# Patient Record
Sex: Female | Born: 1960
Health system: Southern US, Community
[De-identification: ages and names within clinical notes are randomized; demographics above are authoritative.]

## PROBLEM LIST (undated history)

## (undated) DIAGNOSIS — B019 Varicella without complication: Secondary | ICD-10-CM

## (undated) HISTORY — DX: Varicella without complication: B01.9

---

## 1991-05-09 HISTORY — PX: OVARIAN CYST SURGERY: SHX726

## 2014-08-24 ENCOUNTER — Other Ambulatory Visit: Payer: Self-pay

## 2014-08-24 DIAGNOSIS — Z1231 Encounter for screening mammogram for malignant neoplasm of breast: Secondary | ICD-10-CM

## 2014-09-02 ENCOUNTER — Encounter (INDEPENDENT_AMBULATORY_CARE_PROVIDER_SITE_OTHER): Payer: Self-pay

## 2014-09-02 ENCOUNTER — Ambulatory Visit
Admission: RE | Admit: 2014-09-02 | Discharge: 2014-09-02 | Disposition: A | Discharge status: Home / Self Care | Payer: 59 | Source: Ambulatory Visit

## 2014-09-02 DIAGNOSIS — Z1231 Encounter for screening mammogram for malignant neoplasm of breast: Secondary | ICD-10-CM

## 2014-09-04 ENCOUNTER — Other Ambulatory Visit: Payer: Self-pay | Admitting: Internal Medicine

## 2014-09-04 DIAGNOSIS — R928 Other abnormal and inconclusive findings on diagnostic imaging of breast: Secondary | ICD-10-CM

## 2014-09-10 ENCOUNTER — Ambulatory Visit
Admission: RE | Admit: 2014-09-10 | Discharge: 2014-09-10 | Disposition: A | Discharge status: Home / Self Care | Payer: 59 | Source: Ambulatory Visit | Attending: Internal Medicine | Admitting: Internal Medicine

## 2014-09-10 DIAGNOSIS — R928 Other abnormal and inconclusive findings on diagnostic imaging of breast: Secondary | ICD-10-CM

## 2014-10-23 LAB — HM COLONOSCOPY

## 2015-02-09 ENCOUNTER — Other Ambulatory Visit: Payer: Self-pay | Admitting: Internal Medicine

## 2015-02-09 DIAGNOSIS — N6489 Other specified disorders of breast: Secondary | ICD-10-CM

## 2015-02-09 DIAGNOSIS — R921 Mammographic calcification found on diagnostic imaging of breast: Secondary | ICD-10-CM

## 2015-03-16 ENCOUNTER — Ambulatory Visit
Admission: RE | Admit: 2015-03-16 | Discharge: 2015-03-16 | Disposition: A | Discharge status: Home / Self Care | Payer: 59 | Source: Ambulatory Visit | Attending: Internal Medicine | Admitting: Internal Medicine

## 2015-03-16 DIAGNOSIS — R921 Mammographic calcification found on diagnostic imaging of breast: Secondary | ICD-10-CM

## 2015-03-16 DIAGNOSIS — N6489 Other specified disorders of breast: Secondary | ICD-10-CM

## 2015-07-31 DIAGNOSIS — H524 Presbyopia: Secondary | ICD-10-CM | POA: Diagnosis not present

## 2015-07-31 DIAGNOSIS — H5203 Hypermetropia, bilateral: Secondary | ICD-10-CM | POA: Diagnosis not present

## 2015-08-16 ENCOUNTER — Other Ambulatory Visit: Payer: Self-pay

## 2015-08-16 DIAGNOSIS — Z1231 Encounter for screening mammogram for malignant neoplasm of breast: Secondary | ICD-10-CM

## 2015-09-08 ENCOUNTER — Ambulatory Visit
Admission: RE | Admit: 2015-09-08 | Discharge: 2015-09-08 | Disposition: A | Discharge status: Home / Self Care | Payer: 59 | Source: Ambulatory Visit

## 2015-09-08 DIAGNOSIS — Z1231 Encounter for screening mammogram for malignant neoplasm of breast: Secondary | ICD-10-CM

## 2015-09-20 DIAGNOSIS — Z01419 Encounter for gynecological examination (general) (routine) without abnormal findings: Secondary | ICD-10-CM | POA: Diagnosis not present

## 2015-09-20 DIAGNOSIS — Z1212 Encounter for screening for malignant neoplasm of rectum: Secondary | ICD-10-CM | POA: Diagnosis not present

## 2015-09-20 DIAGNOSIS — Z0000 Encounter for general adult medical examination without abnormal findings: Secondary | ICD-10-CM | POA: Diagnosis not present

## 2015-11-18 ENCOUNTER — Other Ambulatory Visit: Payer: Self-pay | Admitting: Obstetrics & Gynecology

## 2015-11-18 DIAGNOSIS — L237 Allergic contact dermatitis due to plants, except food: Secondary | ICD-10-CM | POA: Insufficient documentation

## 2015-11-18 MED ORDER — PREDNISONE 20 MG PO TABS: ORAL_TABLET | ORAL | Status: DC

## 2015-11-18 MED FILL — predniSONE 20 MG TABS: 20 | 21 days supply | Qty: 42 | Fill #0

## 2015-11-18 NOTE — Progress Notes (Signed)
Pt was exposed to poison ivy this weekend.  Lesion on her left leg, left arm, and trunk are progressively getting worse.  Topical steroids and baths are not helping.  Prednisone taper ordered.  Pt advised to complete 21 day course to decrease the chance of rebound.  If worsens, pt to go to PCP.    Pt denies medical problems or past medical history Medications:  OTC vitamins Alergies:  Denies  Guss Bunde., MD

## 2016-05-24 ENCOUNTER — Ambulatory Visit: Payer: 59 | Admitting: Family

## 2016-06-13 ENCOUNTER — Encounter: Payer: Self-pay | Admitting: Nurse Practitioner

## 2016-06-13 ENCOUNTER — Ambulatory Visit (INDEPENDENT_AMBULATORY_CARE_PROVIDER_SITE_OTHER): Payer: 59 | Admitting: Nurse Practitioner

## 2016-06-13 VITALS — BP 120/78 | HR 78 | Temp 98.9°F | Ht 64.0 in | Wt 127.0 lb

## 2016-06-13 DIAGNOSIS — R202 Paresthesia of skin: Secondary | ICD-10-CM | POA: Diagnosis not present

## 2016-06-13 DIAGNOSIS — R0789 Other chest pain: Secondary | ICD-10-CM

## 2016-06-13 DIAGNOSIS — M25551 Pain in right hip: Secondary | ICD-10-CM | POA: Diagnosis not present

## 2016-06-13 DIAGNOSIS — M25552 Pain in left hip: Secondary | ICD-10-CM | POA: Diagnosis not present

## 2016-06-13 NOTE — Progress Notes (Signed)
Pre visit review using our clinic review tool, if applicable. No additional management support is needed unless otherwise documented below in the visit note. 

## 2016-06-13 NOTE — Progress Notes (Signed)
Subjective:    Patient ID: Julia Weiss, female    DOB: March 22, 1961, 56 y.o.   MRN: [id number]  Patient presents today for establish care (new patient)  Hip Pain   Incident onset: several months. There was no injury mechanism. The pain is present in the left hip and right hip. The quality of the pain is described as aching (stiffness). The pain has been fluctuating since onset. Pertinent negatives include no inability to bear weight, loss of motion, loss of sensation, muscle weakness, numbness or tingling. She reports no foreign bodies present. Exacerbated by: prolonged sitting. Treatments tried: movement.  Chest Pain   This is a chronic problem. The current episode started more than 1 year ago. The onset quality is gradual. The problem occurs intermittently. The problem has been waxing and waning. The pain is present in the lateral region. The pain is mild. The quality of the pain is described as sharp. The pain does not radiate. Pertinent negatives include no abdominal pain, back pain, claudication, cough, dizziness, exertional chest pressure, fever, headaches, irregular heartbeat, lower extremity edema, malaise/fatigue, nausea, near-syncope, numbness, orthopnea, palpitations, PND, shortness of breath, sputum production or syncope. Risk factors include post-menopausal.  Her family medical history is significant for heart disease and hypertension.  Pertinent negatives for family medical history include: no stroke and no sudden death. Prior workup: ECG done Aug 27, 2014 (normal per patient)     Immunizations: (TDAP, Hep C screen, Pneumovax, Influenza, zoster)  Health Maintenance  Topic Date Due  .  Hepatitis C: One time screening is recommended by Center for Disease Control  (CDC) for  adults born from [birth year] through 27-Aug-1962.   10-25-1960  . Pap Smear  07/27/1982  . Colon Cancer Screening  07/27/2011  . Mammogram  09/07/2017  . Tetanus Vaccine  03/08/2024  . Flu Shot  Addressed  . HIV Screening   Addressed   Diet: healthy Weight:  Wt Readings from Last 3 Encounters:  06/13/16 127 lb (57.6 kg)   Exercise:walking  Fall Risk: Fall Risk  06/13/2016  Falls in the past year? No   Home Safety:home alone Depression/Suicide: Depression screen [facility] 2/9 06/13/2016  Decreased Interest 0  Down, Depressed, Hopeless 0  PHQ - 2 Score 0   No flowsheet data found. Colonoscopy (every 5-69yrs, >50-79yrs):2016 (normal per patient) Pap Smear (every 68yrs for >21-29 without HPV, every 31yrs for >30-22yrs with HPV): [facility] last done 09/2015(normal per patient) Mammogram (yearly, >80yrs):last done 09/2015 (normal per patient)- 3D mammogram needed due to fibrous breast tissue) Vision:up to date  Dental:up to date Sexual History (birth control, marital status, STD):divorced, 1 adult child  Medications and allergies reviewed with patient and updated if appropriate.  Patient Active Problem List   Diagnosis Date Noted  . Atypical chest pain 06/13/2016  . Pain of both hip joints 06/13/2016  . Paresthesia of both hands 06/13/2016  . Poison ivy dermatitis 11/18/2015    Current Outpatient Prescriptions on File Prior to Visit  Medication Sig Dispense Refill  . predniSONE (DELTASONE) 20 MG tablet Take 3 tablets daily by mouth for 7 days, then 2 tablets daily by mouth for 7 days, then take 1 tablet daily by mouth for 7 days (Patient not taking: Reported on 06/13/2016) 42 tablet 0   No current facility-administered medications on file prior to visit.     Past Medical History:  Diagnosis Date  . Chicken pox     Past Surgical History:  Procedure Laterality Date  .  OVARIAN CYST SURGERY  1993   removal of cyst during pregnancy/came back normal    Social History   Social History  . Marital status: Divorced    Spouse name: N/A  . Number of children: N/A  . Years of education: N/A   Social History Main Topics  . Smoking status: Never Smoker  . Smokeless tobacco:  Never Used  . Alcohol use Yes     Comment: wine/social  . Drug use: No  . Sexual activity: Not Asked   Other Topics Concern  . None   Social History Narrative  . None    Family History  Problem Relation Age of Onset  . Hypertension Mother   . Cancer Father     prostate and kidney cancer  . Diabetes Maternal Uncle   . Cancer Maternal Grandmother     breast cancer  . Heart disease Maternal Grandmother   . Glaucoma Maternal Grandmother   . Macular degeneration Maternal Grandmother   . Arthritis Paternal Grandmother   . Cancer Paternal Grandfather     prostate cancer        Review of Systems  Constitutional: Negative for fever and malaise/fatigue.  Respiratory: Negative for cough, sputum production and shortness of breath.   Cardiovascular: Positive for chest pain. Negative for palpitations, orthopnea, claudication, syncope, PND and near-syncope.  Gastrointestinal: Negative for abdominal pain and nausea.  Musculoskeletal: Negative for back pain.  Neurological: Negative for dizziness, tingling, numbness and headaches.    Objective:   Vitals:   06/13/16 1515  BP: 120/78  Pulse: 78  Temp: 98.9 F (37.2 C)    Body mass index is 21.8 kg/m.   Physical Examination:  Physical Exam  Constitutional: She is oriented to person, place, and time and well-developed, well-nourished, and in no distress. No distress.  HENT:  Right Ear: External ear normal.  Left Ear: External ear normal.  Nose: Nose normal.  Mouth/Throat: Oropharynx is clear and moist. No oropharyngeal exudate.  Eyes: Conjunctivae and EOM are normal. Pupils are equal, round, and reactive to light. No scleral icterus.  Neck: Normal range of motion. Neck supple. No thyromegaly present.  Cardiovascular: Normal rate, normal heart sounds and intact distal pulses.   Pulmonary/Chest: Effort normal and breath sounds normal. She exhibits no tenderness.  Abdominal: Soft. Bowel sounds are normal. She exhibits no  distension. There is no tenderness.  Musculoskeletal: Normal range of motion. She exhibits no edema or tenderness.  Lymphadenopathy:    She has no cervical adenopathy.  Neurological: She is alert and oriented to person, place, and time. Gait normal.  Skin: Skin is warm and dry.  Psychiatric: Affect and judgment normal.  Vitals reviewed.   ASSESSMENT and PLAN:  Markiah was seen today for establish care.  Diagnoses and all orders for this visit:  Atypical chest pain -     EXERCISE TOLERANCE TEST; Future  Pain of both hip joints  Paresthesia of both hands    No problem-specific Assessment & Plan notes found for this encounter.      Follow up: Return in about 3 months (around 09/11/2016) for CPE (fasting), labs (cbc, TSH. CMP, lipid, vit B12,).  Wilfred Lacy, NP

## 2016-06-13 NOTE — Patient Instructions (Addendum)
Does not want any medication or imaging at this time. She will like to have labs done with CPE in May,2018.  Consider referral to Sports medicine for hip  Pain.  Consider hip x-ray for hip pain. Consider cervical spine x-ray for bilateral hand presthesia.  Consider cervical spine MRI if cervical spine x-ray and labs are normal.  Please sign medical release to get records from Dr. Bryon Lions.

## 2016-06-26 ENCOUNTER — Telehealth (HOSPITAL_COMMUNITY): Payer: Self-pay | Admitting: Nurse Practitioner

## 2016-06-28 NOTE — Telephone Encounter (Signed)
06/26/2016 09:21 AM Phone (Outgoing) Julia Weiss, Julia Weiss (Self) [phone] (H)   Left Message - Called pt and lmsg changing her appt time to 9:45 abnd informing her that the address that she will be going to is [address]     By Verdene Rio

## 2016-07-07 ENCOUNTER — Telehealth (HOSPITAL_COMMUNITY): Payer: Self-pay

## 2016-07-07 NOTE — Telephone Encounter (Signed)
Encounter complete. 

## 2016-07-12 ENCOUNTER — Ambulatory Visit (HOSPITAL_COMMUNITY)
Admission: RE | Admit: 2016-07-12 | Discharge: 2016-07-12 | Disposition: A | Discharge status: Home / Self Care | Payer: 59 | Source: Ambulatory Visit | Attending: Cardiovascular Disease | Admitting: Cardiovascular Disease

## 2016-07-12 ENCOUNTER — Encounter (HOSPITAL_COMMUNITY): Payer: 59

## 2016-07-12 DIAGNOSIS — R0789 Other chest pain: Secondary | ICD-10-CM | POA: Diagnosis not present

## 2016-07-12 LAB — EXERCISE TOLERANCE TEST
CHL CUP MPHR: 166 {beats}/min
CHL CUP RESTING HR STRESS: 65 {beats}/min
CSEPEW: 14.9 METS
CSEPPHR: 181 {beats}/min
Exercise duration (min): 12 min
Exercise duration (sec): 50 s
Percent HR: 109 %
RPE: 16

## 2016-08-01 DIAGNOSIS — L821 Other seborrheic keratosis: Secondary | ICD-10-CM | POA: Diagnosis not present

## 2016-08-01 DIAGNOSIS — D229 Melanocytic nevi, unspecified: Secondary | ICD-10-CM | POA: Diagnosis not present

## 2016-08-07 ENCOUNTER — Encounter: Payer: Self-pay | Admitting: Nurse Practitioner

## 2016-09-20 ENCOUNTER — Ambulatory Visit (INDEPENDENT_AMBULATORY_CARE_PROVIDER_SITE_OTHER): Payer: 59 | Admitting: Nurse Practitioner

## 2016-09-20 ENCOUNTER — Encounter: Payer: Self-pay | Admitting: Nurse Practitioner

## 2016-09-20 VITALS — BP 122/80 | HR 60 | Temp 97.9°F | Ht 64.0 in | Wt 123.0 lb

## 2016-09-20 DIAGNOSIS — Z136 Encounter for screening for cardiovascular disorders: Secondary | ICD-10-CM

## 2016-09-20 DIAGNOSIS — Z13220 Encounter for screening for lipoid disorders: Secondary | ICD-10-CM

## 2016-09-20 DIAGNOSIS — Z1159 Encounter for screening for other viral diseases: Secondary | ICD-10-CM

## 2016-09-20 DIAGNOSIS — Z0000 Encounter for general adult medical examination without abnormal findings: Secondary | ICD-10-CM

## 2016-09-20 NOTE — Progress Notes (Signed)
Subjective:    Patient ID: Julia Weiss, female    DOB: 1960-07-08, 56 y.o.   MRN: [id number]  Patient presents today for complete physical  HPI  Denies any acute complaint today.  Hip pain and chest pain have resolved.  Immunizations: (TDAP, Hep C screen, Pneumovax, Influenza, zoster)  Health Maintenance  Topic Date Due  .  Hepatitis C: One time screening is recommended by Center for Disease Control  (CDC) for  adults born from [birth year] through 1964.   1960/07/20  . Pap Smear  07/27/1982  . Colon Cancer Screening  07/27/2011  . Flu Shot  12/06/2016  . Mammogram  09/07/2017  . Tetanus Vaccine  03/08/2024  . HIV Screening  Completed   Diet:healthy.  Weight:  Wt Readings from Last 3 Encounters:  09/20/16 123 lb (55.8 kg)  06/13/16 127 lb (57.6 kg)   Exercise:walking.  Fall Risk: Fall Risk  06/13/2016  Falls in the past year? No   Home Safety:home alone.  Depression/Suicide: Depression screen [facility] 2/9 09/20/2016 06/13/2016  Decreased Interest 0 0  Down, Depressed, Hopeless 0 0  PHQ - 2 Score 0 0   No flowsheet data found. Colonoscopy (every 5-62yrs, >50-44yrs):done 2016 (normal per patient), records requested  Pap Smear (every 45yrs for >21-29 without HPV, every 43yrs for >30-58yrs with HPV):deferred to GYN by patient. Has upcoming appt next week.  Mammogram (yearly, >54yrs):up to date.  Vision:up to date.  Dental:up to date.   Medications and allergies reviewed with patient and updated if appropriate.  Patient Active Problem List   Diagnosis Date Noted  . Paresthesia of both hands 06/13/2016    No current outpatient prescriptions on file prior to visit.   No current facility-administered medications on file prior to visit.     Past Medical History:  Diagnosis Date  . Chicken pox     Past Surgical History:  Procedure Laterality Date  . OVARIAN CYST SURGERY  1993   removal of cyst during pregnancy/came back normal    Social History   Social  History  . Marital status: Divorced    Spouse name: N/A  . Number of children: N/A  . Years of education: N/A   Social History Main Topics  . Smoking status: Never Smoker  . Smokeless tobacco: Never Used  . Alcohol use Yes     Comment: wine/social  . Drug use: No  . Sexual activity: Not Asked   Other Topics Concern  . None   Social History Narrative  . None    Family History  Problem Relation Age of Onset  . Hypertension Mother   . Cancer Father        prostate and kidney cancer  . Diabetes Maternal Uncle   . Cancer Maternal Grandmother        breast cancer  . Heart disease Maternal Grandmother   . Glaucoma Maternal Grandmother   . Macular degeneration Maternal Grandmother   . Arthritis Paternal Grandmother   . Cancer Paternal Grandfather        prostate cancer        Review of Systems  Constitutional: Negative for fever, malaise/fatigue and weight loss.  HENT: Negative for congestion and sore throat.   Eyes:       Negative for visual changes  Respiratory: Negative for cough and shortness of breath.   Cardiovascular: Negative for chest pain, palpitations and leg swelling.  Gastrointestinal: Negative for blood in stool, constipation, diarrhea and heartburn.  Genitourinary: Negative for dysuria,  frequency and urgency.  Musculoskeletal: Negative for falls, joint pain and myalgias.  Skin: Negative for rash.  Neurological: Negative for dizziness, sensory change and headaches.  Endo/Heme/Allergies: Does not bruise/bleed easily.  Psychiatric/Behavioral: Negative for depression, substance abuse and suicidal ideas. The patient is not nervous/anxious.     Objective:   Vitals:   09/20/16 1444  BP: 122/80  Pulse: 60  Temp: 97.9 F (36.6 C)    Body mass index is 21.11 kg/m.   Physical Examination:  Physical Exam  Constitutional: She is oriented to person, place, and time and well-developed, well-nourished, and in no distress. No distress.  HENT:  Right  Ear: External ear normal.  Left Ear: External ear normal.  Nose: Nose normal.  Eyes: Conjunctivae and EOM are normal. Pupils are equal, round, and reactive to light. No scleral icterus.  Neck: Normal range of motion. Neck supple. No thyromegaly present.  Cardiovascular: Normal rate, normal heart sounds and intact distal pulses.   Pulmonary/Chest: Effort normal and breath sounds normal. She exhibits no tenderness.  Deferred to GYN per patient.  Abdominal: Soft. Bowel sounds are normal. She exhibits no distension. There is no tenderness.  Genitourinary:  Genitourinary Comments: Deferred to GYN per patient  Musculoskeletal: Normal range of motion. She exhibits no edema, tenderness or deformity.  Lymphadenopathy:    She has no cervical adenopathy.  Neurological: She is alert and oriented to person, place, and time. She has normal reflexes. No cranial nerve deficit. Gait normal. Coordination normal.  Skin: Skin is warm and dry.  Psychiatric: Affect and judgment normal.  Vitals reviewed.   ASSESSMENT and PLAN:  Beulah was seen today for annual exam.  Diagnoses and all orders for this visit:  Preventative health care -     Comprehensive metabolic panel; Future -     Hepatitis C Antibody; Future -     CBC; Future -     TSH; Future -     Lipid panel; Future  Encounter for lipid screening for cardiovascular disease -     Lipid panel; Future  Encounter for hepatitis C screening test for low risk patient -     Hepatitis C Antibody; Future   No problem-specific Assessment & Plan notes found for this encounter.     Follow up: Return if symptoms worsen or fail to improve.  Wilfred Lacy, NP

## 2016-09-20 NOTE — Patient Instructions (Addendum)
Go to lab for blood draw (need to be fasting at least 6-8hrs prior for blood draw).  Have colonoscopy and PAP smear report faxed to me.  Health Maintenance, Female Adopting a healthy lifestyle and getting preventive care can go a long way to promote health and wellness. Talk with your health care provider about what schedule of regular examinations is right for you. This is a good chance for you to check in with your provider about disease prevention and staying healthy. In between checkups, there are plenty of things you can do on your own. Experts have done a lot of research about which lifestyle changes and preventive measures are most likely to keep you healthy. Ask your health care provider for more information. Weight and diet Eat a healthy diet  Be sure to include plenty of vegetables, fruits, low-fat dairy products, and lean protein.  Do not eat a lot of foods high in solid fats, added sugars, or salt.  Get regular exercise. This is one of the most important things you can do for your health.  Most adults should exercise for at least 150 minutes each week. The exercise should increase your heart rate and make you sweat (moderate-intensity exercise).  Most adults should also do strengthening exercises at least twice a week. This is in addition to the moderate-intensity exercise. Maintain a healthy weight  Body mass index (BMI) is a measurement that can be used to identify possible weight problems. It estimates body fat based on height and weight. Your health care provider can help determine your BMI and help you achieve or maintain a healthy weight.  For females 78 years of age and older:  A BMI below 18.5 is considered underweight.  A BMI of 18.5 to 24.9 is normal.  A BMI of 25 to 29.9 is considered overweight.  A BMI of 30 and above is considered obese. Watch levels of cholesterol and blood lipids  You should start having your blood tested for lipids and cholesterol at 55  years of age, then have this test every 5 years.  You may need to have your cholesterol levels checked more often if:  Your lipid or cholesterol levels are high.  You are older than 55 years of age.  You are at high risk for heart disease. Cancer screening Lung Cancer  Lung cancer screening is recommended for adults 12-6 years old who are at high risk for lung cancer because of a history of smoking.  A yearly low-dose CT scan of the lungs is recommended for people who:  Currently smoke.  Have quit within the past 15 years.  Have at least a 30-pack-year history of smoking. A pack year is smoking an average of one pack of cigarettes a day for 1 year.  Yearly screening should continue until it has been 15 years since you quit.  Yearly screening should stop if you develop a health problem that would prevent you from having lung cancer treatment. Breast Cancer  Practice breast self-awareness. This means understanding how your breasts normally appear and feel.  It also means doing regular breast self-exams. Let your health care provider know about any changes, no matter how small.  If you are in your 20s or 30s, you should have a clinical breast exam (CBE) by a health care provider every 1-3 years as part of a regular health exam.  If you are 37 or older, have a CBE every year. Also consider having a breast X-ray (mammogram) every year.  If  you have a family history of breast cancer, talk to your health care provider about genetic screening.  If you are at high risk for breast cancer, talk to your health care provider about having an MRI and a mammogram every year.  Breast cancer gene (BRCA) assessment is recommended for women who have family members with BRCA-related cancers. BRCA-related cancers include:  Breast.  Ovarian.  Tubal.  Peritoneal cancers.  Results of the assessment will determine the need for genetic counseling and BRCA1 and BRCA2 testing. Cervical Cancer   Your health care provider may recommend that you be screened regularly for cancer of the pelvic organs (ovaries, uterus, and vagina). This screening involves a pelvic examination, including checking for microscopic changes to the surface of your cervix (Pap test). You may be encouraged to have this screening done every 3 years, beginning at age 35.  For women ages 17-65, health care providers may recommend pelvic exams and Pap testing every 3 years, or they may recommend the Pap and pelvic exam, combined with testing for human papilloma virus (HPV), every 5 years. Some types of HPV increase your risk of cervical cancer. Testing for HPV may also be done on women of any age with unclear Pap test results.  Other health care providers may not recommend any screening for nonpregnant women who are considered low risk for pelvic cancer and who do not have symptoms. Ask your health care provider if a screening pelvic exam is right for you.  If you have had past treatment for cervical cancer or a condition that could lead to cancer, you need Pap tests and screening for cancer for at least 20 years after your treatment. If Pap tests have been discontinued, your risk factors (such as having a new sexual partner) need to be reassessed to determine if screening should resume. Some women have medical problems that increase the chance of getting cervical cancer. In these cases, your health care provider may recommend more frequent screening and Pap tests. Colorectal Cancer  This type of cancer can be detected and often prevented.  Routine colorectal cancer screening usually begins at 55 years of age and continues through 55 years of age.  Your health care provider may recommend screening at an earlier age if you have risk factors for colon cancer.  Your health care provider may also recommend using home test kits to check for hidden blood in the stool.  A small camera at the end of a tube can be used to examine  your colon directly (sigmoidoscopy or colonoscopy). This is done to check for the earliest forms of colorectal cancer.  Routine screening usually begins at age 34.  Direct examination of the colon should be repeated every 5-10 years through 55 years of age. However, you may need to be screened more often if early forms of precancerous polyps or small growths are found. Skin Cancer  Check your skin from head to toe regularly.  Tell your health care provider about any new moles or changes in moles, especially if there is a change in a mole's shape or color.  Also tell your health care provider if you have a mole that is larger than the size of a pencil eraser.  Always use sunscreen. Apply sunscreen liberally and repeatedly throughout the day.  Protect yourself by wearing long sleeves, pants, a wide-brimmed hat, and sunglasses whenever you are outside. Heart disease, diabetes, and high blood pressure  High blood pressure causes heart disease and increases the risk of  stroke. High blood pressure is more likely to develop in:  People who have blood pressure in the high end of the normal range (130-139/85-89 mm Hg).  People who are overweight or obese.  People who are African American.  If you are 64-63 years of age, have your blood pressure checked every 3-5 years. If you are 1 years of age or older, have your blood pressure checked every year. You should have your blood pressure measured twice-once when you are at a hospital or clinic, and once when you are not at a hospital or clinic. Record the average of the two measurements. To check your blood pressure when you are not at a hospital or clinic, you can use:  An automated blood pressure machine at a pharmacy.  A home blood pressure monitor.  If you are between 20 years and 34 years old, ask your health care provider if you should take aspirin to prevent strokes.  Have regular diabetes screenings. This involves taking a blood sample  to check your fasting blood sugar level.  If you are at a normal weight and have a low risk for diabetes, have this test once every three years after 55 years of age.  If you are overweight and have a high risk for diabetes, consider being tested at a younger age or more often. Preventing infection Hepatitis B  If you have a higher risk for hepatitis B, you should be screened for this virus. You are considered at high risk for hepatitis B if:  You were born in a country where hepatitis B is common. Ask your health care provider which countries are considered high risk.  Your parents were born in a high-risk country, and you have not been immunized against hepatitis B (hepatitis B vaccine).  You have HIV or AIDS.  You use needles to inject street drugs.  You live with someone who has hepatitis B.  You have had sex with someone who has hepatitis B.  You get hemodialysis treatment.  You take certain medicines for conditions, including cancer, organ transplantation, and autoimmune conditions. Hepatitis C  Blood testing is recommended for:  Everyone born from [birth year] through 1964.  Anyone with known risk factors for hepatitis C. Sexually transmitted infections (STIs)  You should be screened for sexually transmitted infections (STIs) including gonorrhea and chlamydia if:  You are sexually active and are younger than 55 years of age.  You are older than 55 years of age and your health care provider tells you that you are at risk for this type of infection.  Your sexual activity has changed since you were last screened and you are at an increased risk for chlamydia or gonorrhea. Ask your health care provider if you are at risk.  If you do not have HIV, but are at risk, it may be recommended that you take a prescription medicine daily to prevent HIV infection. This is called pre-exposure prophylaxis (PrEP). You are considered at risk if:  You are sexually active and do not regularly  use condoms or know the HIV status of your partner(s).  You take drugs by injection.  You are sexually active with a partner who has HIV. Talk with your health care provider about whether you are at high risk of being infected with HIV. If you choose to begin PrEP, you should first be tested for HIV. You should then be tested every 3 months for as long as you are taking PrEP. Pregnancy  If you are premenopausal  and you may become pregnant, ask your health care provider about preconception counseling.  If you may become pregnant, take 400 to 800 micrograms (mcg) of folic acid every day.  If you want to prevent pregnancy, talk to your health care provider about birth control (contraception). Osteoporosis and menopause  Osteoporosis is a disease in which the bones lose minerals and strength with aging. This can result in serious bone fractures. Your risk for osteoporosis can be identified using a bone density scan.  If you are 12 years of age or older, or if you are at risk for osteoporosis and fractures, ask your health care provider if you should be screened.  Ask your health care provider whether you should take a calcium or vitamin D supplement to lower your risk for osteoporosis.  Menopause may have certain physical symptoms and risks.  Hormone replacement therapy may reduce some of these symptoms and risks. Talk to your health care provider about whether hormone replacement therapy is right for you. Follow these instructions at home:  Schedule regular health, dental, and eye exams.  Stay current with your immunizations.  Do not use any tobacco products including cigarettes, chewing tobacco, or electronic cigarettes.  If you are pregnant, do not drink alcohol.  If you are breastfeeding, limit how much and how often you drink alcohol.  Limit alcohol intake to no more than 1 drink per day for nonpregnant women. One drink equals 12 ounces of beer, 5 ounces of wine, or 1 ounces of  hard liquor.  Do not use street drugs.  Do not share needles.  Ask your health care provider for help if you need support or information about quitting drugs.  Tell your health care provider if you often feel depressed.  Tell your health care provider if you have ever been abused or do not feel safe at home. This information is not intended to replace advice given to you by your health care provider. Make sure you discuss any questions you have with your health care provider. Document Released: 11/07/2010 Document Revised: 09/30/2015 Document Reviewed: 01/26/2015 Elsevier Interactive Patient Education  2017 [employer].

## 2016-09-25 DIAGNOSIS — Z13820 Encounter for screening for osteoporosis: Secondary | ICD-10-CM | POA: Diagnosis not present

## 2016-09-25 DIAGNOSIS — Z113 Encounter for screening for infections with a predominantly sexual mode of transmission: Secondary | ICD-10-CM | POA: Diagnosis not present

## 2016-09-25 DIAGNOSIS — Z1231 Encounter for screening mammogram for malignant neoplasm of breast: Secondary | ICD-10-CM | POA: Diagnosis not present

## 2016-09-25 DIAGNOSIS — Z01419 Encounter for gynecological examination (general) (routine) without abnormal findings: Secondary | ICD-10-CM | POA: Diagnosis not present

## 2016-09-25 DIAGNOSIS — Z6821 Body mass index (BMI) 21.0-21.9, adult: Secondary | ICD-10-CM | POA: Diagnosis not present

## 2016-09-25 DIAGNOSIS — N760 Acute vaginitis: Secondary | ICD-10-CM | POA: Diagnosis not present

## 2016-09-25 LAB — HM PAP SMEAR: HM PAP: NEGATIVE

## 2016-09-25 LAB — HM DEXA SCAN

## 2016-09-26 MED FILL — metroNIDAZOLE 500 MG TABS: 500 | 7 days supply | Qty: 14 | Fill #0

## 2016-09-29 DIAGNOSIS — H903 Sensorineural hearing loss, bilateral: Secondary | ICD-10-CM | POA: Diagnosis not present

## 2016-10-13 DIAGNOSIS — H903 Sensorineural hearing loss, bilateral: Secondary | ICD-10-CM | POA: Diagnosis not present

## 2017-05-31 ENCOUNTER — Ambulatory Visit: Payer: 59 | Admitting: Family Medicine

## 2017-05-31 ENCOUNTER — Ambulatory Visit (INDEPENDENT_AMBULATORY_CARE_PROVIDER_SITE_OTHER): Payer: No Typology Code available for payment source | Admitting: Family Medicine

## 2017-05-31 ENCOUNTER — Encounter: Payer: Self-pay | Admitting: Family Medicine

## 2017-05-31 VITALS — BP 133/78 | HR 72 | Temp 98.6°F | Wt 128.0 lb

## 2017-05-31 DIAGNOSIS — E559 Vitamin D deficiency, unspecified: Secondary | ICD-10-CM

## 2017-05-31 DIAGNOSIS — Z13220 Encounter for screening for lipoid disorders: Secondary | ICD-10-CM | POA: Diagnosis not present

## 2017-05-31 DIAGNOSIS — Z0000 Encounter for general adult medical examination without abnormal findings: Secondary | ICD-10-CM

## 2017-05-31 DIAGNOSIS — E2839 Other primary ovarian failure: Secondary | ICD-10-CM

## 2017-05-31 NOTE — Progress Notes (Signed)
Subjective:    Julia Weiss is a 57 y.o. female and is here for a comprehensive physical exam.  Pertinent Gynecological History: Patient's last menstrual period was 12/29/2016.  Health Maintenance Due  Topic Date Due  . Hepatitis C Screening  June 25, 1960  . PAP SMEAR  07/27/1982  . COLONOSCOPY  07/27/2011   PMHx, SurgHx, SocialHx, Medications, and Allergies were reviewed in the Visit Navigator and updated as appropriate.   Past Medical History:  Diagnosis Date  . Chicken pox    Past Surgical History:  Procedure Laterality Date  . OVARIAN CYST SURGERY  1993   removal of cyst during pregnancy/came back normal   Family History  Problem Relation Age of Onset  . Hypertension Mother   . Cancer Father        prostate and kidney cancer  . Diabetes Maternal Uncle   . Cancer Maternal Grandmother        breast cancer  . Heart disease Maternal Grandmother   . Glaucoma Maternal Grandmother   . Macular degeneration Maternal Grandmother   . Arthritis Paternal Grandmother   . Cancer Paternal Grandfather        prostate cancer   Social History   Tobacco Use  . Smoking status: Never Smoker  . Smokeless tobacco: Never Used  Substance Use Topics  . Alcohol use: Yes    Comment: wine/social  . Drug use: No   Review of Systems:   Pertinent items are noted in the HPI. Otherwise, ROS is negative.  Objective:   BP 133/78   Pulse 72   Temp 98.6 F (37 C) (Oral)   Wt 128 lb (58.1 kg)   LMP 12/29/2016   SpO2 98%   BMI 21.97 kg/m    Wt Readings from Last 3 Encounters:  05/31/17 128 lb (58.1 kg)  09/20/16 123 lb (55.8 kg)  06/13/16 127 lb (57.6 kg)     Ht Readings from Last 3 Encounters:  09/20/16 5\' 4"  (1.626 m)  06/13/16 5\' 4"  (1.626 m)   General appearance: alert, cooperative and appears stated age. Head: normocephalic, without obvious abnormality, atraumatic. Neck: no adenopathy, supple, symmetrical, trachea midline; thyroid not enlarged, symmetric, no  tenderness/mass/nodules. Lungs: clear to auscultation bilaterally. Heart: regular rate and rhythm Abdomen: soft, non-tender; no masses,  no organomegaly. Extremities: extremities normal, atraumatic, no cyanosis or edema. Skin: skin color, texture, turgor normal, no rashes or lesions. Lymph: cervical, supraclavicular, and axillary nodes normal; no abnormal inguinal nodes palpated. Neurologic: grossly normal.  Assessment/Plan:   [name] was seen today for establish care.  Diagnoses and all orders for this visit:  Routine physical examination -     CBC with Differential/Platelet -     Comprehensive metabolic panel  Screening for lipid disorders -     Lipid panel  Vitamin D deficiency -     VITAMIN D 25 Hydroxy (Vit-D Deficiency, Fractures)  Patient Counseling: [x]    Nutrition: Stressed importance of moderation in sodium/caffeine intake, saturated fat and cholesterol, caloric balance, sufficient intake of fresh fruits, vegetables, fiber, calcium, iron, and 1 mg of folate supplement per day (for females capable of pregnancy).  [x]    Stressed the importance of regular exercise.   [x]    Substance Abuse: Discussed cessation/primary prevention of tobacco, alcohol, or other drug use; driving or other dangerous activities under the influence; availability of treatment for abuse.   [x]    Injury prevention: Discussed safety belts, safety helmets, smoke detector, smoking near bedding or upholstery.   [x]   Sexuality: Discussed sexually transmitted diseases, partner selection, use of condoms, avoidance of unintended pregnancy  and contraceptive alternatives.  [x]    Dental health: Discussed importance of regular tooth brushing, flossing, and dental visits.  [x]    Health maintenance and immunizations reviewed. Please refer to Health maintenance section.   Briscoe Deutscher, DO [place]

## 2017-06-22 ENCOUNTER — Other Ambulatory Visit: Payer: Self-pay

## 2017-06-22 DIAGNOSIS — Z1239 Encounter for other screening for malignant neoplasm of breast: Secondary | ICD-10-CM

## 2017-07-13 ENCOUNTER — Ambulatory Visit: Payer: No Typology Code available for payment source

## 2017-07-17 ENCOUNTER — Encounter: Payer: 59 | Admitting: Family Medicine

## 2017-08-03 ENCOUNTER — Telehealth: Payer: Self-pay

## 2017-08-03 ENCOUNTER — Other Ambulatory Visit: Payer: Self-pay

## 2017-08-03 DIAGNOSIS — Z0000 Encounter for general adult medical examination without abnormal findings: Secondary | ICD-10-CM

## 2017-08-03 DIAGNOSIS — E559 Vitamin D deficiency, unspecified: Secondary | ICD-10-CM

## 2017-08-03 NOTE — Telephone Encounter (Signed)
Pt coming for labs 08/06/17. Need future orders placed from Dr. Juleen China. Thank you.

## 2017-08-03 NOTE — Telephone Encounter (Signed)
Orders put in

## 2017-08-06 ENCOUNTER — Other Ambulatory Visit (INDEPENDENT_AMBULATORY_CARE_PROVIDER_SITE_OTHER): Payer: No Typology Code available for payment source

## 2017-08-06 DIAGNOSIS — Z0000 Encounter for general adult medical examination without abnormal findings: Secondary | ICD-10-CM | POA: Diagnosis not present

## 2017-08-06 DIAGNOSIS — E559 Vitamin D deficiency, unspecified: Secondary | ICD-10-CM | POA: Diagnosis not present

## 2017-08-06 LAB — CBC WITH DIFFERENTIAL/PLATELET
Basophils Absolute: 0.1 10*3/uL (ref 0.0–0.1)
Basophils Relative: 1.2 % (ref 0.0–3.0)
Eosinophils Absolute: 0 10*3/uL (ref 0.0–0.7)
Eosinophils Relative: 0.7 % (ref 0.0–5.0)
HCT: 40.6 % (ref 36.0–46.0)
Hemoglobin: 13.8 g/dL (ref 12.0–15.0)
Lymphocytes Relative: 36.4 % (ref 12.0–46.0)
Lymphs Abs: 1.7 10*3/uL (ref 0.7–4.0)
MCHC: 34 g/dL (ref 30.0–36.0)
MCV: 94.5 fl (ref 78.0–100.0)
Monocytes Absolute: 0.3 10*3/uL (ref 0.1–1.0)
Monocytes Relative: 7.5 % (ref 3.0–12.0)
Neutro Abs: 2.5 10*3/uL (ref 1.4–7.7)
Neutrophils Relative %: 54.2 % (ref 43.0–77.0)
Platelets: 287 10*3/uL (ref 150.0–400.0)
RBC: 4.3 Mil/uL (ref 3.87–5.11)
RDW: 12.8 % (ref 11.5–15.5)
WBC: 4.6 10*3/uL (ref 4.0–10.5)

## 2017-08-06 LAB — LIPID PANEL
Cholesterol: 181 mg/dL (ref 0–200)
HDL: 84.5 mg/dL (ref >39.00)
LDL Cholesterol: 84 mg/dL (ref 0–99)
NonHDL: 96.86
Total CHOL/HDL Ratio: 2
Triglycerides: 65 mg/dL (ref 0.0–149.0)
VLDL: 13 mg/dL (ref 0.0–40.0)

## 2017-08-06 LAB — VITAMIN D 25 HYDROXY (VIT D DEFICIENCY, FRACTURES): VITD: 38.58 ng/mL (ref 30.00–100.00)

## 2017-08-06 LAB — COMPREHENSIVE METABOLIC PANEL
ALT: 19 U/L (ref 0–35)
AST: 26 U/L (ref 0–37)
Albumin: 4.6 g/dL (ref 3.5–5.2)
Alkaline Phosphatase: 60 U/L (ref 39–117)
BUN: 13 mg/dL (ref 6–23)
CO2: 29 mEq/L (ref 19–32)
Calcium: 9.7 mg/dL (ref 8.4–10.5)
Chloride: 102 mEq/L (ref 96–112)
Creatinine, Ser: 0.8 mg/dL (ref 0.40–1.20)
GFR: 78.85 mL/min (ref >60.00)
Glucose, Bld: 112 mg/dL — ABNORMAL HIGH (ref 70–99)
Potassium: 5.2 mEq/L — ABNORMAL HIGH (ref 3.5–5.1)
Sodium: 138 mEq/L (ref 135–145)
Total Bilirubin: 0.4 mg/dL (ref 0.2–1.2)
Total Protein: 7.7 g/dL (ref 6.0–8.3)

## 2017-08-10 ENCOUNTER — Other Ambulatory Visit: Payer: Self-pay | Admitting: Surgical

## 2017-08-10 ENCOUNTER — Encounter: Payer: Self-pay | Admitting: Surgical

## 2017-08-20 ENCOUNTER — Ambulatory Visit (INDEPENDENT_AMBULATORY_CARE_PROVIDER_SITE_OTHER): Payer: No Typology Code available for payment source | Admitting: Family Medicine

## 2017-08-20 ENCOUNTER — Encounter: Payer: Self-pay | Admitting: Family Medicine

## 2017-08-20 VITALS — BP 116/78 | HR 65 | Temp 98.6°F | Ht 64.0 in | Wt 123.2 lb

## 2017-08-20 DIAGNOSIS — L989 Disorder of the skin and subcutaneous tissue, unspecified: Secondary | ICD-10-CM

## 2017-08-20 DIAGNOSIS — Z9189 Other specified personal risk factors, not elsewhere classified: Secondary | ICD-10-CM

## 2017-08-20 DIAGNOSIS — Z1159 Encounter for screening for other viral diseases: Secondary | ICD-10-CM

## 2017-08-20 DIAGNOSIS — R739 Hyperglycemia, unspecified: Secondary | ICD-10-CM

## 2017-08-20 LAB — COMPREHENSIVE METABOLIC PANEL
ALT: 15 U/L (ref 0–35)
AST: 21 U/L (ref 0–37)
Albumin: 4.8 g/dL (ref 3.5–5.2)
Alkaline Phosphatase: 64 U/L (ref 39–117)
BUN: 14 mg/dL (ref 6–23)
CO2: 28 mEq/L (ref 19–32)
Calcium: 9.8 mg/dL (ref 8.4–10.5)
Chloride: 99 mEq/L (ref 96–112)
Creatinine, Ser: 0.78 mg/dL (ref 0.40–1.20)
GFR: 81.18 mL/min (ref >60.00)
Glucose, Bld: 94 mg/dL (ref 70–99)
Potassium: 4.4 mEq/L (ref 3.5–5.1)
Sodium: 136 mEq/L (ref 135–145)
Total Bilirubin: 0.8 mg/dL (ref 0.2–1.2)
Total Protein: 7.5 g/dL (ref 6.0–8.3)

## 2017-08-20 LAB — HEMOGLOBIN A1C: Hgb A1c MFr Bld: 5.4 % (ref 4.6–6.5)

## 2017-08-20 LAB — C-REACTIVE PROTEIN: CRP: 0.1 mg/dL — ABNORMAL LOW (ref 0.5–20.0)

## 2017-08-20 NOTE — Progress Notes (Signed)
Julia Weiss is a 57 y.o. female is here for follow up.  History of Present Illness:   HPI: Patient presents to discuss her lab work today.  Recent labs did indicate hyperglycemia and a slightly elevated potassium.  We are going to recheck that today.  She was also concerned about her cholesterol panel since her HDL was higher more recently.  She exercises regularly.  Diet is impeccable.  She does drink 1-2 glasses of wine many nights per week.  She takes a multivitamin with niacin as well.  Strong family history of coronary artery disease.  Non-smoker.  The 10-year ASCVD risk score Mikey Bussing DC Brooke Bonito., et al., 2013) is: 1.1%   Values used to calculate the score:     Age: 66 years     Sex: Female     Is Non-Hispanic African American: No     Diabetic: No     Tobacco smoker: No     Systolic Blood Pressure: 888 mmHg     Is BP treated: No     HDL Cholesterol: 84.5 mg/dL     Total Cholesterol: 181 mg/dL  She does ask for a CRP today.  No menses since August.  She understands that this is perimenopause.  She also understands that perimenopause can alter her cholesterol panel somewhat.  She also asks about a lesion on her left calf today.  It is been there for over 1 year.  She has scratched it.  It did come back.  She has a dermatologist but cannot remember the name at this moment.  Health Maintenance Due  Topic Date Due  . Hepatitis C Screening  1960-07-16  . COLONOSCOPY  07/27/2011   Depression screen PHQ 2/9 09/20/2016 06/13/2016  Decreased Interest 0 0  Down, Depressed, Hopeless 0 0  PHQ - 2 Score 0 0   PMHx, SurgHx, SocialHx, FamHx, Medications, and Allergies were reviewed in the Visit Navigator and updated as appropriate.  There are no active problems to display for this patient.  Social History   Tobacco Use  . Smoking status: Never Smoker  . Smokeless tobacco: Never Used  Substance Use Topics  . Alcohol use: Yes    Comment: wine/social  . Drug use: No   Current  Medications and Allergies:   .  aspirin EC 81 MG tablet, Take 81 mg by mouth daily., Disp: , Rfl:  .  Calcium 600-200 MG-UNIT tablet, Take 1 tablet by mouth daily., Disp: , Rfl:  .  Multiple Vitamin (MULTIVITAMIN) tablet, Take 1 tablet by mouth daily., Disp: , Rfl:   No Known Allergies   Review of Systems   Pertinent items are noted in the HPI. Otherwise, ROS is negative.  Vitals:   Vitals:   08/20/17 0739  BP: 116/78  Pulse: 65  Temp: 98.6 F (37 C)  TempSrc: Oral  SpO2: 98%  Weight: 123 lb 3.2 oz (55.9 kg)  Height: '5\' 4"'  (1.626 m)     Body mass index is 21.15 kg/m.   Physical Exam:   Physical Exam  Constitutional: She appears well-nourished.  HENT:  Head: Normocephalic and atraumatic.  Eyes: Pupils are equal, round, and reactive to light. EOM are normal.  Neck: Normal range of motion. Neck supple.  Cardiovascular: Normal rate, regular rhythm, normal heart sounds and intact distal pulses.  Pulmonary/Chest: Effort normal.  Abdominal: Soft.  Skin: Skin is warm.  4 mm flesh-colored but scaly lesion on her left calf.  Psychiatric: She has a normal mood and  affect. Her behavior is normal.  Nursing note and vitals reviewed.   Assessment and Plan:   Diagnoses and all orders for this visit:  Hyperglycemia -     Hemoglobin A1c -     Comp Met (CMET) -     C-reactive protein  Skin lesion Comments: Biopsy today.  Procedure Note:   Procedure: Skin biopsy Indication: Suspicious lesion  Risks including unsuccessful procedure, bleeding, infection, bruising, scar, a need for another complete procedure and others were explained to the patient in detail as well as the benefits. Informed consent was obtained and signed.   The patient was placed in a decubitus position.  Lesion #1 on her left calf measuring 3-4 mm  Skin over lesion #1  was prepped with Betadine and alcohol  and anesthetized with 1 cc of 2% lidocaine and epinephrine, using a 25-gauge 1 inch needle.   Shave biopsy with a sterile Dermablade was carried out in the usual fashion. Drysol was used to destroy the rest of the lesion potentially left behind and for hemostasis. Band-Aid was applied with antibiotic ointment.  Orders: -     Dermatology pathology  Encounter for hepatitis C virus screening test for high risk patient -     Hepatitis C antibody  Try to reassure the patient regarding her cholesterol panel.  She is low risk for coronary disease.  If her CRP happens to be elevated, we will obtain a cardiac calcium score.  . Reviewed expectations re: course of current medical issues. . Discussed self-management of symptoms. . Outlined signs and symptoms indicating need for more acute intervention. . Patient verbalized understanding and all questions were answered. Marland Kitchen Health Maintenance issues including appropriate healthy diet, exercise, and smoking avoidance were discussed with patient. . See orders for this visit as documented in the electronic medical record. . Patient received an After Visit Summary.  Briscoe Deutscher, DO [place], [place] 08/20/2017  No future appointments.

## 2017-08-21 ENCOUNTER — Encounter: Payer: Self-pay | Admitting: Surgical

## 2017-08-21 LAB — HEPATITIS C ANTIBODY
Hepatitis C Ab: NONREACTIVE
SIGNAL TO CUT-OFF: 0.01 (ref <1.00)

## 2017-08-24 ENCOUNTER — Telehealth: Payer: No Typology Code available for payment source | Admitting: Nurse Practitioner

## 2017-08-24 DIAGNOSIS — R21 Rash and other nonspecific skin eruption: Secondary | ICD-10-CM

## 2017-08-24 MED ORDER — PREDNISONE 10 MG PO TABS: 10.0000 mg | ORAL_TABLET | Freq: Every day | ORAL | 0 refills | Status: DC

## 2017-08-24 MED FILL — predniSONE 10 MG TABS: 10 | 5 days supply | Qty: 5 | Fill #0

## 2017-08-24 NOTE — Progress Notes (Signed)
E Visit for Rash  We are sorry that you are not feeling well. Here is how we plan to help!  Based on what you shared with me it looks like you have contact dermatitis.  Contact dermatitis is a skin rash caused by something that touches the skin and causes irritation or inflammation.  Your skin may be red, swollen, dry, cracked, and itch.  The rash should go away in a few days but can last a few weeks.  If you get a rash, it's important to figure out what caused it so the irritant can be avoided in the future. and I have prescribed Prednisone 10 mg daily for 5 days           HOME CARE:   Take cool showers and avoid direct sunlight.  Apply cool compress or wet dressings.  Take a bath in an oatmeal bath.  Sprinkle content of one Aveeno packet under running faucet with comfortably warm water.  Bathe for 15-20 minutes, 1-2 times daily.  Pat dry with a towel. Do not rub the rash.  Use hydrocortisone cream.  Take an antihistamine like Benadryl for widespread rashes that itch.  The adult dose of Benadryl is 25-50 mg by mouth 4 times daily.  Caution:  This type of medication may cause sleepiness.  Do not drink alcohol, drive, or operate dangerous machinery while taking antihistamines.  Do not take these medications if you have prostate enlargement.  Read package instructions thoroughly on all medications that you take.  GET HELP RIGHT AWAY IF:   Symptoms don't go away after treatment.  Severe itching that persists.  If you rash spreads or swells.  If you rash begins to smell.  If it blisters and opens or develops a yellow-brown crust.  You develop a fever.  You have a sore throat.  You become short of breath.  MAKE SURE YOU:  Understand these instructions. Will watch your condition. Will get help right away if you are not doing well or get worse.  Thank you for choosing an e-visit. Your e-visit answers were reviewed by a board certified advanced clinical practitioner to  complete your personal care plan. Depending upon the condition, your plan could have included both over the counter or prescription medications. Please review your pharmacy choice. Be sure that the pharmacy you have chosen is open so that you can pick up your prescription now.  If there is a problem you may message your provider in MyChart to have the prescription routed to another pharmacy. Your safety is important to us. If you have drug allergies check your prescription carefully.  For the next 24 hours, you can use MyChart to ask questions about today's visit, request a non-urgent call back, or ask for a work or school excuse from your e-visit provider. You will get an email in the next two days asking about your experience. I hope that your e-visit has been valuable and will speed your recovery.     

## 2017-08-27 MED ORDER — MUPIROCIN 2 % EX OINT: 1.0000 "application " | TOPICAL_OINTMENT | Freq: Two times a day (BID) | CUTANEOUS | 0 refills | Status: DC

## 2017-08-27 MED ORDER — DOXYCYCLINE HYCLATE 100 MG PO TABS: 100.0000 mg | ORAL_TABLET | Freq: Two times a day (BID) | ORAL | 0 refills | Status: DC

## 2017-08-27 NOTE — Telephone Encounter (Signed)
Patient email with pictures of her wound site.  Concern for some sloughing and possibly some surrounding infection.  I called and mupirocin.  She may try this for the first day or so but if the redness or sloughing increase, go ahead and start the doxycycline.

## 2017-08-29 ENCOUNTER — Encounter: Payer: Self-pay | Admitting: Family Medicine

## 2017-08-31 NOTE — Telephone Encounter (Signed)
Was taken care of.

## 2017-09-04 ENCOUNTER — Encounter: Payer: Self-pay | Admitting: Family Medicine

## 2017-09-05 ENCOUNTER — Telehealth: Payer: Self-pay | Admitting: Family Medicine

## 2017-09-05 DIAGNOSIS — L989 Disorder of the skin and subcutaneous tissue, unspecified: Secondary | ICD-10-CM

## 2017-09-05 NOTE — Telephone Encounter (Signed)
Copied from [place] [id number]. Topic: Quick Communication - See Telephone Encounter >> Sep 05, 2017 11:23 AM Robina Ade, Helene Kelp D wrote: CRM for notification. See Telephone encounter for: 09/05/17. Patient called returning [place] call.

## 2017-09-05 NOTE — Telephone Encounter (Signed)
See note

## 2017-09-05 NOTE — Telephone Encounter (Signed)
I have placed a referral for the patient to see Dermatology. Patient is going to pick up the prescription for the antibiotic. She did not know there was an antibiotic sent in.

## 2017-09-10 ENCOUNTER — Encounter: Payer: Self-pay | Admitting: Family Medicine

## 2017-09-11 ENCOUNTER — Encounter: Payer: Self-pay | Admitting: Family Medicine

## 2017-09-14 ENCOUNTER — Other Ambulatory Visit: Payer: Self-pay | Admitting: Surgical

## 2017-09-14 DIAGNOSIS — Z1239 Encounter for other screening for malignant neoplasm of breast: Secondary | ICD-10-CM

## 2017-09-26 ENCOUNTER — Ambulatory Visit
Admission: RE | Admit: 2017-09-26 | Discharge: 2017-09-26 | Disposition: A | Discharge status: Home / Self Care | Payer: No Typology Code available for payment source | Source: Ambulatory Visit | Attending: Family Medicine | Admitting: Family Medicine

## 2017-09-26 DIAGNOSIS — Z1239 Encounter for other screening for malignant neoplasm of breast: Secondary | ICD-10-CM

## 2017-11-19 ENCOUNTER — Encounter: Payer: Self-pay | Admitting: Family Medicine

## 2017-11-19 ENCOUNTER — Telehealth: Payer: No Typology Code available for payment source | Admitting: Family

## 2017-11-19 ENCOUNTER — Other Ambulatory Visit: Payer: Self-pay | Admitting: Nurse Practitioner

## 2017-11-19 DIAGNOSIS — L237 Allergic contact dermatitis due to plants, except food: Secondary | ICD-10-CM

## 2017-11-19 MED ORDER — PREDNISONE 10 MG (21) PO TBPK: ORAL_TABLET | ORAL | 0 refills | Status: DC

## 2017-11-19 MED FILL — predniSONE 10 MG TABS: 10 | 6 days supply | Qty: 21 | Fill #0

## 2017-11-19 NOTE — Progress Notes (Signed)

## 2017-11-21 MED FILL — TRIAMCINOLONE 0.1% CREAM: 0.1 | 20 days supply | Qty: 80 | Fill #0

## 2018-04-23 ENCOUNTER — Encounter: Payer: Self-pay | Admitting: Family Medicine

## 2018-05-10 ENCOUNTER — Encounter: Payer: Self-pay | Admitting: Family Medicine

## 2018-06-12 ENCOUNTER — Encounter: Payer: Self-pay | Admitting: Family Medicine

## 2018-06-12 ENCOUNTER — Ambulatory Visit (INDEPENDENT_AMBULATORY_CARE_PROVIDER_SITE_OTHER): Payer: No Typology Code available for payment source | Admitting: Family Medicine

## 2018-06-12 VITALS — BP 118/68 | HR 87 | Temp 98.6°F | Ht 64.0 in | Wt 123.4 lb

## 2018-06-12 DIAGNOSIS — R5383 Other fatigue: Secondary | ICD-10-CM | POA: Diagnosis not present

## 2018-06-12 DIAGNOSIS — Z13220 Encounter for screening for lipoid disorders: Secondary | ICD-10-CM | POA: Diagnosis not present

## 2018-06-12 DIAGNOSIS — E559 Vitamin D deficiency, unspecified: Secondary | ICD-10-CM | POA: Diagnosis not present

## 2018-06-12 DIAGNOSIS — L989 Disorder of the skin and subcutaneous tissue, unspecified: Secondary | ICD-10-CM

## 2018-06-12 DIAGNOSIS — E2839 Other primary ovarian failure: Secondary | ICD-10-CM

## 2018-06-12 DIAGNOSIS — Z0000 Encounter for general adult medical examination without abnormal findings: Secondary | ICD-10-CM | POA: Diagnosis not present

## 2018-06-12 NOTE — Progress Notes (Signed)
Subjective:    Julia Weiss is a 58 y.o. female and is here for a comprehensive physical exam.  There are no preventive care reminders to display for this patient.   Current Outpatient Medications:  .  Calcium 600-200 MG-UNIT tablet, Take 1 tablet by mouth daily., Disp: , Rfl:  .  Multiple Vitamin (MULTIVITAMIN) tablet, Take 1 tablet by mouth daily., Disp: , Rfl:   PMHx, SurgHx, SocialHx, Medications, and Allergies were reviewed in the Visit Navigator and updated as appropriate.   Past Medical History:  Diagnosis Date  . Chicken pox   . Chickenpox      Past Surgical History:  Procedure Laterality Date  . OVARIAN CYST SURGERY  1993   removal of cyst during pregnancy/came back normal     Family History  Problem Relation Age of Onset  . Hypertension Mother   . Cancer Father        prostate and kidney cancer  . Diabetes Maternal Uncle   . Cancer Maternal Grandmother        breast cancer  . Heart disease Maternal Grandmother   . Glaucoma Maternal Grandmother   . Macular degeneration Maternal Grandmother   . Arthritis Paternal Grandmother   . Cancer Paternal Grandfather        prostate cancer  . Breast cancer Neg Hx     Social History   Tobacco Use  . Smoking status: Never Smoker  . Smokeless tobacco: Never Used  Substance Use Topics  . Alcohol use: Yes    Comment: wine/social  . Drug use: No    Review of Systems:   Pertinent items are noted in the HPI. Otherwise, ROS is negative.  Objective:   BP 118/68   Pulse 87   Temp 98.6 F (37 C) (Oral)   Ht 5\' 4"  (1.626 m)   Wt 123 lb 6.4 oz (56 kg)   LMP 12/29/2016   SpO2 97%   BMI 21.18 kg/m   General appearance: alert, cooperative and appears stated age. Head: normocephalic, without obvious abnormality, atraumatic. Neck: no adenopathy, supple, symmetrical, trachea midline; thyroid not enlarged, symmetric, no tenderness/mass/nodules. Lungs: clear to auscultation bilaterally. Heart: regular rate  and rhythm Abdomen: soft, non-tender; no masses,  no organomegaly. Extremities: extremities normal, atraumatic, no cyanosis or edema. Skin: skin color, texture, turgor normal, no rashes or lesions. Lymph: cervical, supraclavicular, and axillary nodes normal; no abnormal inguinal nodes palpated. Neurologic: grossly normal.                Assessment/Plan:   [name] was seen today for annual exam.  Diagnoses and all orders for this visit:  Routine physical examination  Skin lesion of cheek -     Ambulatory referral to Dermatology  Other fatigue -     CBC with Differential/Platelet -     Comprehensive metabolic panel -     Vitamin B12  Vitamin D deficiency -     VITAMIN D 25 Hydroxy (Vit-D Deficiency, Fractures)  Estrogen deficiency  Screening for lipid disorders -     Lipid panel    Patient Counseling: [x]    Nutrition: Stressed importance of moderation in sodium/caffeine intake, saturated fat and cholesterol, caloric balance, sufficient intake of fresh fruits, vegetables, fiber, calcium, iron, and 1 mg of folate supplement per day (for females capable of pregnancy).  [x]    Stressed the importance of regular exercise.   [x]    Substance Abuse: Discussed cessation/primary prevention of tobacco, alcohol, or other drug use; driving or other  dangerous activities under the influence; availability of treatment for abuse.   [x]    Injury prevention: Discussed safety belts, safety helmets, smoke detector, smoking near bedding or upholstery.   [x]    Sexuality: Discussed sexually transmitted diseases, partner selection, use of condoms, avoidance of unintended pregnancy  and contraceptive alternatives.  [x]    Dental health: Discussed importance of regular tooth brushing, flossing, and dental visits.  [x]    Health maintenance and immunizations reviewed. Please refer to Health maintenance section.   Briscoe Deutscher, DO [place]

## 2018-06-12 NOTE — Patient Instructions (Signed)
Preventive Care 40-64 Years, Female Preventive care refers to lifestyle choices and visits with your health care provider that can promote health and wellness. What does preventive care include?   A yearly physical exam. This is also called an annual well check.  Dental exams once or twice a year.  Routine eye exams. Ask your health care provider how often you should have your eyes checked.  Personal lifestyle choices, including: ? Daily care of your teeth and gums. ? Regular physical activity. ? Eating a healthy diet. ? Avoiding tobacco and drug use. ? Limiting alcohol use. ? Practicing safe sex. ? Taking low-dose aspirin daily starting at age 50. ? Taking vitamin and mineral supplements as recommended by your health care provider. What happens during an annual well check? The services and screenings done by your health care provider during your annual well check will depend on your age, overall health, lifestyle risk factors, and family history of disease. Counseling Your health care provider may ask you questions about your:  Alcohol use.  Tobacco use.  Drug use.  Emotional well-being.  Home and relationship well-being.  Sexual activity.  Eating habits.  Work and work environment.  Method of birth control.  Menstrual cycle.  Pregnancy history. Screening You may have the following tests or measurements:  Height, weight, and BMI.  Blood pressure.  Lipid and cholesterol levels. These may be checked every 5 years, or more frequently if you are over 50 years old.  Skin check.  Lung cancer screening. You may have this screening every year starting at age 55 if you have a 30-pack-year history of smoking and currently smoke or have quit within the past 15 years.  Colorectal cancer screening. All adults should have this screening starting at age 50 and continuing until age 75. Your health care provider may recommend screening at age 45. You will have tests every  1-10 years, depending on your results and the type of screening test. People at increased risk should start screening at an earlier age. Screening tests may include: ? Guaiac-based fecal occult blood testing. ? Fecal immunochemical test (FIT). ? Stool DNA test. ? Virtual colonoscopy. ? Sigmoidoscopy. During this test, a flexible tube with a tiny camera (sigmoidoscope) is used to examine your rectum and lower colon. The sigmoidoscope is inserted through your anus into your rectum and lower colon. ? Colonoscopy. During this test, a long, thin, flexible tube with a tiny camera (colonoscope) is used to examine your entire colon and rectum.  Hepatitis C blood test.  Hepatitis B blood test.  Sexually transmitted disease (STD) testing.  Diabetes screening. This is done by checking your blood sugar (glucose) after you have not eaten for a while (fasting). You may have this done every 1-3 years.  Mammogram. This may be done every 1-2 years. Talk to your health care provider about when you should start having regular mammograms. This may depend on whether you have a family history of breast cancer.  BRCA-related cancer screening. This may be done if you have a family history of breast, ovarian, tubal, or peritoneal cancers.  Pelvic exam and Pap test. This may be done every 3 years starting at age 21. Starting at age 30, this may be done every 5 years if you have a Pap test in combination with an HPV test.  Bone density scan. This is done to screen for osteoporosis. You may have this scan if you are at high risk for osteoporosis. Discuss your test results, treatment options,   and if necessary, the need for more tests with your health care provider. Vaccines Your health care provider may recommend certain vaccines, such as:  Influenza vaccine. This is recommended every year.  Tetanus, diphtheria, and acellular pertussis (Tdap, Td) vaccine. You may need a Td booster every 10 years.  Varicella  vaccine. You may need this if you have not been vaccinated.  Zoster vaccine. You may need this after age 38.  Measles, mumps, and rubella (MMR) vaccine. You may need at least one dose of MMR if you were born in 1957 or later. You may also need a second dose.  Pneumococcal 13-valent conjugate (PCV13) vaccine. You may need this if you have certain conditions and were not previously vaccinated.  Pneumococcal polysaccharide (PPSV23) vaccine. You may need one or two doses if you smoke cigarettes or if you have certain conditions.  Meningococcal vaccine. You may need this if you have certain conditions.  Hepatitis A vaccine. You may need this if you have certain conditions or if you travel or work in places where you may be exposed to hepatitis A.  Hepatitis B vaccine. You may need this if you have certain conditions or if you travel or work in places where you may be exposed to hepatitis B.  Haemophilus influenzae type b (Hib) vaccine. You may need this if you have certain conditions. Talk to your health care provider about which screenings and vaccines you need and how often you need them. This information is not intended to replace advice given to you by your health care provider. Make sure you discuss any questions you have with your health care provider. Document Released: 05/21/2015 Document Revised: 06/14/2017 Document Reviewed: 02/23/2015 Elsevier Interactive Patient Education  2019 [employer].

## 2018-06-13 ENCOUNTER — Encounter: Payer: Self-pay | Admitting: Family Medicine

## 2018-06-13 LAB — LIPID PANEL
Cholesterol: 186 mg/dL (ref 0–200)
HDL: 86.4 mg/dL (ref >39.00)
LDL Cholesterol: 88 mg/dL (ref 0–99)
NonHDL: 99.23
Total CHOL/HDL Ratio: 2
Triglycerides: 56 mg/dL (ref 0.0–149.0)
VLDL: 11.2 mg/dL (ref 0.0–40.0)

## 2018-06-13 LAB — COMPREHENSIVE METABOLIC PANEL
ALT: 17 U/L (ref 0–35)
AST: 23 U/L (ref 0–37)
Albumin: 4.6 g/dL (ref 3.5–5.2)
Alkaline Phosphatase: 75 U/L (ref 39–117)
BUN: 14 mg/dL (ref 6–23)
CO2: 30 mEq/L (ref 19–32)
Calcium: 9.4 mg/dL (ref 8.4–10.5)
Chloride: 101 mEq/L (ref 96–112)
Creatinine, Ser: 0.86 mg/dL (ref 0.40–1.20)
GFR: 68.04 mL/min (ref >60.00)
Glucose, Bld: 84 mg/dL (ref 70–99)
Potassium: 4.2 mEq/L (ref 3.5–5.1)
Sodium: 138 mEq/L (ref 135–145)
Total Bilirubin: 0.5 mg/dL (ref 0.2–1.2)
Total Protein: 6.9 g/dL (ref 6.0–8.3)

## 2018-06-13 LAB — CBC WITH DIFFERENTIAL/PLATELET
Basophils Absolute: 0 10*3/uL (ref 0.0–0.1)
Basophils Relative: 1 % (ref 0.0–3.0)
Eosinophils Absolute: 0 10*3/uL (ref 0.0–0.7)
Eosinophils Relative: 0.6 % (ref 0.0–5.0)
HCT: 38.2 % (ref 36.0–46.0)
Hemoglobin: 13.1 g/dL (ref 12.0–15.0)
Lymphocytes Relative: 37.9 % (ref 12.0–46.0)
Lymphs Abs: 1.9 10*3/uL (ref 0.7–4.0)
MCHC: 34.2 g/dL (ref 30.0–36.0)
MCV: 93.5 fl (ref 78.0–100.0)
Monocytes Absolute: 0.4 10*3/uL (ref 0.1–1.0)
Monocytes Relative: 8.5 % (ref 3.0–12.0)
Neutro Abs: 2.6 10*3/uL (ref 1.4–7.7)
Neutrophils Relative %: 52 % (ref 43.0–77.0)
Platelets: 284 10*3/uL (ref 150.0–400.0)
RBC: 4.09 Mil/uL (ref 3.87–5.11)
RDW: 12.7 % (ref 11.5–15.5)
WBC: 5 10*3/uL (ref 4.0–10.5)

## 2018-06-13 LAB — VITAMIN D 25 HYDROXY (VIT D DEFICIENCY, FRACTURES): VITD: 29.98 ng/mL — ABNORMAL LOW (ref 30.00–100.00)

## 2018-06-13 LAB — VITAMIN B12: Vitamin B-12: 160 pg/mL — ABNORMAL LOW (ref 211–911)

## 2018-09-27 ENCOUNTER — Encounter: Payer: Self-pay | Admitting: Family Medicine

## 2018-09-27 DIAGNOSIS — Z1239 Encounter for other screening for malignant neoplasm of breast: Secondary | ICD-10-CM

## 2018-10-07 ENCOUNTER — Ambulatory Visit: Payer: No Typology Code available for payment source

## 2018-10-09 ENCOUNTER — Encounter: Payer: Self-pay | Admitting: Family Medicine

## 2018-10-09 ENCOUNTER — Other Ambulatory Visit: Payer: Self-pay | Admitting: Physical Therapy

## 2018-10-09 DIAGNOSIS — M25521 Pain in right elbow: Secondary | ICD-10-CM

## 2018-10-15 ENCOUNTER — Encounter: Payer: Self-pay | Admitting: Family Medicine

## 2018-10-17 ENCOUNTER — Encounter: Payer: Self-pay | Admitting: Family Medicine

## 2018-10-22 NOTE — Telephone Encounter (Signed)
L/m to all office.

## 2018-10-23 ENCOUNTER — Other Ambulatory Visit: Payer: Self-pay

## 2018-10-23 MED ORDER — BACLOFEN 10 MG PO TABS: 10.0000 mg | ORAL_TABLET | Freq: Every day | ORAL | 0 refills | Status: DC

## 2018-10-23 MED ORDER — MELOXICAM 7.5 MG PO TABS: 7.5000 mg | ORAL_TABLET | Freq: Every day | ORAL | 0 refills | Status: DC

## 2018-10-23 MED FILL — MELOXICAM 7.5 MG TABLET: 7.5 | 30 days supply | Qty: 30 | Fill #0

## 2018-10-23 MED FILL — BACLOFEN 10 MG TABS: 10 | 30 days supply | Qty: 30 | Fill #0

## 2018-10-23 NOTE — Telephone Encounter (Signed)
Called patient we have called in meds for her to try. She does have the brace and sleeve and will try meds for a few weeks. She will call in a few weeks if she needs pt she will call for [place] to place orders.

## 2018-11-19 ENCOUNTER — Ambulatory Visit
Admission: RE | Admit: 2018-11-19 | Discharge: 2018-11-19 | Disposition: A | Discharge status: Home / Self Care | Payer: No Typology Code available for payment source | Source: Ambulatory Visit | Attending: Family Medicine | Admitting: Family Medicine

## 2018-11-19 ENCOUNTER — Other Ambulatory Visit: Payer: Self-pay

## 2018-11-19 DIAGNOSIS — Z1239 Encounter for other screening for malignant neoplasm of breast: Secondary | ICD-10-CM

## 2018-12-25 ENCOUNTER — Encounter: Payer: Self-pay | Admitting: *Deleted

## 2019-01-22 ENCOUNTER — Other Ambulatory Visit: Payer: Self-pay

## 2019-01-22 DIAGNOSIS — Z20822 Contact with and (suspected) exposure to covid-19: Secondary | ICD-10-CM

## 2019-01-23 LAB — NOVEL CORONAVIRUS, NAA: SARS-CoV-2, NAA: NOT DETECTED

## 2019-03-25 ENCOUNTER — Telehealth: Payer: No Typology Code available for payment source | Admitting: Emergency Medicine

## 2019-03-25 DIAGNOSIS — R21 Rash and other nonspecific skin eruption: Secondary | ICD-10-CM | POA: Diagnosis not present

## 2019-03-25 DIAGNOSIS — W57XXXA Bitten or stung by nonvenomous insect and other nonvenomous arthropods, initial encounter: Secondary | ICD-10-CM

## 2019-03-25 MED ORDER — DOXYCYCLINE HYCLATE 100 MG PO CAPS: 200.0000 mg | ORAL_CAPSULE | Freq: Once | ORAL | 0 refills | Status: AC

## 2019-03-25 MED FILL — DOXYCYCLINE HYC 100 MG CAPS: 100 | 1 days supply | Qty: 2 | Fill #0

## 2019-03-25 NOTE — Progress Notes (Signed)
Thank you for describing your tick bite, Here is how we plan to help! Based on the information that you shared with me it looks like you have A tick that bite that we will treat with a short course of doxycycline.   I don't think that antibiotic therapy is mandatory in your situation.  It doesn't sound like the tick was on you for longer than 36 hours, and it doesn't sound like the tick was engorged.  However, based on the red ring that you selected on the questionnaire, a single dose of doxycycline is not unreasonable.  In most cases a tick bite is painless and does not itch.  Most tick bites in which the tick is quickly removed do not require prescriptions. Ticks can transmit several diseases if they are infected and remain attacked to your skin. Therefore the length that the tick was attached and any symptoms you have experienced after the bite are import to accurately develop your custom treatment plan. In most cases a single dose of doxycycline may prevent the development of a more serious condition.   Based on your information I have Provided a home care guide for tick bites and  instructions on when to call for help and I have sent a single dose of doxycycline to the pharmacy you selected. Please make sure that you selected a pharmacy that is open now.  Which ticks  are associated with illness?  The Wood Tick (dog tick) is the size of a watermelon seed and can sometimes transmit [facility] spotted fever and [place] tick fever.   The Deer Tick (black-legged tick) is between the size of a poppy seed (pin head) and an apple seed, and can sometimes transmit Lyme disease.  A brown to black tick with a white splotch on its back is likely a female Amblyomma americanum ([place] tick). This tick has been associated with Southern Tick Associated illness ( STARI)  Lyme disease has become the most common tick-borne illness in the [place]. The risk of Lyme disease following a recognized  deer tick bite is estimated to be 1%.  The majority of cases of Lyme disease start with a bull's eye rash at the site of the tick bite. The rash can occur days to weeks (typically 7-10 days) after a tick bite. Treatment with antibiotics is indicated if this rash appears. Flu-like symptoms may accompany the rash, including: fever, chills, headaches, muscle aches, and fatigue. Removing ticks promptly may prevent tick borne disease.  What can be used to prevent Tick Bites?   Insect repellant with at leas 20% DEET.  Wearing long pants with sock and shoes.  Avoiding tall grass and heavily wooded areas.  Checking your skin after being outdoors.  Shower with a washcloth after outdoor exposures.  HOME CARE ADVICE FOR TICK BITE  1. Wood Tick Removal:  o Use a pair of tweezers and grasp the wood tick close to the skin (on its head). Pull the wood tick straight upward without twisting or crushing it. Maintain a steady pressure until it releases its grip.   o If tweezers aren't available, use fingers, a loop of thread around the jaws, or a needle between the jaws for traction.  o Note: covering the tick with petroleum jelly, nail polish or rubbing alcohol doesn't work. Neither does touching the tick with a hot or cold object. 2. Tiny Deer Tick Removal:   o Needs to be scraped off with a knife blade or credit card edge.  o Place tick in a sealed container (e.g. glass jar, zip lock plastic bag), in case your doctor wants to see it. 3. Tick's Head Removal:  o If the wood tick's head breaks off in the skin, it must be removed. Clean the skin. Then use a sterile needle to uncover the head and lift it out or scrape it off.  o If a very small piece of the head remains, the skin will eventually slough it off. 4. Antibiotic Ointment:  o Wash the wound and your hands with soap and water after removal to prevent catching any tick disease.  Apply an over the counter antibiotic ointment (e.g. bacitracin) to the  bite once. 5. Expected Course: Tick bites normally don't itch or hurt. That's why they often go unnoticed. 6. Call Your Doctor If:  o You can't remove the tick or the tick's head o Fever, a severe head ache, or rash occur in the next 2 weeks o Bite begins to look infected o Lyme's disease is common in your area o You have not had a tetanus in the last 10 years o Your current symptoms become worse    MAKE SURE YOU   Understand these instructions.  Will watch your condition.  Will get help right away if you are not doing well or get worse.   Thank you for choosing an e-visit.  Your e-visit answers were reviewed by a board certified advanced clinical practitioner to complete your personal care plan. Depending upon the condition, your plan could have included both over the counter or prescription medications. Please review your pharmacy choice. If there is a problem you may use MyChart messaging to have the prescription routed to another pharmacy. Your safety is important to [place]. If you have drug allergies check your prescription carefully.   You can use MyChart to ask questions about today's visit, request a non-urgent call back, or ask for a work or school excuse for 24 hours related to this e-Visit. If it has been greater than 24 hours you will need to follow up with your provider, or enter a new e-Visit to address those concerns.  You will get an email in the next two days asking about your experience. I hope  that your e-visit has been valuable and will speed your recovery  Greater than 5 minutes, yet less than 10 minutes was used in reviewing the patient's chart, questionnaire, prescribing medications, and documentation for this visit.

## 2019-06-18 ENCOUNTER — Other Ambulatory Visit: Payer: Self-pay

## 2019-06-18 ENCOUNTER — Encounter: Payer: Self-pay | Admitting: Family Medicine

## 2019-06-18 ENCOUNTER — Ambulatory Visit (INDEPENDENT_AMBULATORY_CARE_PROVIDER_SITE_OTHER): Payer: No Typology Code available for payment source | Admitting: Family Medicine

## 2019-06-18 ENCOUNTER — Encounter: Payer: No Typology Code available for payment source | Admitting: Family Medicine

## 2019-06-18 VITALS — BP 126/72 | HR 75 | Temp 97.2°F | Ht 64.0 in | Wt 126.8 lb

## 2019-06-18 DIAGNOSIS — M25561 Pain in right knee: Secondary | ICD-10-CM | POA: Diagnosis not present

## 2019-06-18 DIAGNOSIS — G8929 Other chronic pain: Secondary | ICD-10-CM

## 2019-06-18 DIAGNOSIS — Z0000 Encounter for general adult medical examination without abnormal findings: Secondary | ICD-10-CM

## 2019-06-18 LAB — COMPREHENSIVE METABOLIC PANEL
ALT: 21 U/L (ref 0–35)
AST: 24 U/L (ref 0–37)
Albumin: 4.6 g/dL (ref 3.5–5.2)
Alkaline Phosphatase: 68 U/L (ref 39–117)
BUN: 12 mg/dL (ref 6–23)
CO2: 29 mEq/L (ref 19–32)
Calcium: 9.7 mg/dL (ref 8.4–10.5)
Chloride: 102 mEq/L (ref 96–112)
Creatinine, Ser: 0.87 mg/dL (ref 0.40–1.20)
GFR: 66.9 mL/min (ref >60.00)
Glucose, Bld: 94 mg/dL (ref 70–99)
Potassium: 5.5 mEq/L — ABNORMAL HIGH (ref 3.5–5.1)
Sodium: 137 mEq/L (ref 135–145)
Total Bilirubin: 0.8 mg/dL (ref 0.2–1.2)
Total Protein: 7.4 g/dL (ref 6.0–8.3)

## 2019-06-18 LAB — CBC WITH DIFFERENTIAL/PLATELET
Basophils Absolute: 0.1 10*3/uL (ref 0.0–0.1)
Basophils Relative: 1.1 % (ref 0.0–3.0)
Eosinophils Absolute: 0.1 10*3/uL (ref 0.0–0.7)
Eosinophils Relative: 1 % (ref 0.0–5.0)
HCT: 42.7 % (ref 36.0–46.0)
Hemoglobin: 14.4 g/dL (ref 12.0–15.0)
Lymphocytes Relative: 35.5 % (ref 12.0–46.0)
Lymphs Abs: 1.9 10*3/uL (ref 0.7–4.0)
MCHC: 33.7 g/dL (ref 30.0–36.0)
MCV: 95 fl (ref 78.0–100.0)
Monocytes Absolute: 0.5 10*3/uL (ref 0.1–1.0)
Monocytes Relative: 8.7 % (ref 3.0–12.0)
Neutro Abs: 2.9 10*3/uL (ref 1.4–7.7)
Neutrophils Relative %: 53.7 % (ref 43.0–77.0)
Platelets: 367 10*3/uL (ref 150.0–400.0)
RBC: 4.5 Mil/uL (ref 3.87–5.11)
RDW: 12.9 % (ref 11.5–15.5)
WBC: 5.4 10*3/uL (ref 4.0–10.5)

## 2019-06-18 LAB — LIPID PANEL
Cholesterol: 217 mg/dL — ABNORMAL HIGH (ref 0–200)
HDL: 91.9 mg/dL (ref >39.00)
LDL Cholesterol: 114 mg/dL — ABNORMAL HIGH (ref 0–99)
NonHDL: 125.56
Total CHOL/HDL Ratio: 2
Triglycerides: 56 mg/dL (ref 0.0–149.0)
VLDL: 11.2 mg/dL (ref 0.0–40.0)

## 2019-06-18 LAB — VITAMIN D 25 HYDROXY (VIT D DEFICIENCY, FRACTURES): VITD: 35.16 ng/mL (ref 30.00–100.00)

## 2019-06-18 LAB — TSH: TSH: 1.61 u[IU]/mL (ref 0.35–4.50)

## 2019-06-18 NOTE — Progress Notes (Signed)
Patient: Julia Weiss MRN: [id number] DOB: 1960-06-05 PCP: Orma Flaming, MD     Subjective:  Chief Complaint  Patient presents with  . transfer of care  . Knee Pain    right    HPI: The patient is a 59 y.o. female who presents today for annual exam. She denies any changes to past medical history. There have been no recent hospitalizations. They are following a well balanced diet and exercise plan. Weight has been stable. She is menopausal. (2019). NO HRT.   She does have intermittent right knee pain. She states going down stairs or down a hill can make the pain worse. She tries to modify if it starts to hurt her. Pain is not bad and doesn't need medication. Denies any swelling/redness.    Has had covid vaccine.   Immunization History  Administered Date(s) Administered  . Influenza Whole 01/30/2018  . Influenza,inj,Quad PF,6+ Mos 02/06/2019   Colonoscopy: 10/23/2014 Mammogram: 11/19/2018 Pap smear: 09/25/2016. Negative co testing. Repeat 5 years.   Review of Systems  Constitutional: Negative for chills, fatigue and fever.  HENT: Negative for sinus pressure, sinus pain and sore throat.   Eyes: Negative for pain, discharge and itching.  Respiratory: Negative for cough, choking and chest tightness.   Cardiovascular: Negative for chest pain, palpitations and leg swelling.  Gastrointestinal: Negative for abdominal pain, diarrhea and nausea.  Endocrine: Negative for cold intolerance and heat intolerance.  Genitourinary: Negative for flank pain, frequency and pelvic pain.  Musculoskeletal: Negative for back pain, joint swelling and neck pain.  Skin: Negative for pallor, rash and wound.  Allergic/Immunologic: Negative for environmental allergies and food allergies.  Neurological: Negative for dizziness and headaches.  Psychiatric/Behavioral: Negative for confusion and suicidal ideas. The patient is not nervous/anxious.     Allergies Patient has No Known Allergies.  Past  Medical History Patient  has a past medical history of Chicken pox and Chickenpox.  Surgical History Patient  has a past surgical history that includes Ovarian cyst surgery (1993).  Family History Pateint's family history includes Arthritis in her paternal grandmother; Cancer in her father, maternal grandmother, and paternal grandfather; Diabetes in her maternal uncle; Glaucoma in her maternal grandmother; Heart disease in her maternal grandmother; Hypertension in her mother; Macular degeneration in her maternal grandmother.  Social History Patient  reports that she has never smoked. She has never used smokeless tobacco. She reports current alcohol use. She reports that she does not use drugs.    Objective: Vitals:   06/18/19 0804  BP: 126/72  Pulse: 75  Temp: (!) 97.2 F (36.2 C)  TempSrc: Temporal  SpO2: 95%  Weight: 126 lb 12.8 oz (57.5 kg)  Height: 5\' 4"  (1.626 m)    Body mass index is 21.77 kg/m.  Physical Exam Vitals reviewed.  Constitutional:      Appearance: Normal appearance. She is well-developed and normal weight.  HENT:     Head: Normocephalic and atraumatic.     Right Ear: Tympanic membrane, ear canal and external ear normal.     Left Ear: Tympanic membrane, ear canal and external ear normal.  Eyes:     Conjunctiva/sclera: Conjunctivae normal.     Pupils: Pupils are equal, round, and reactive to light.  Neck:     Thyroid: No thyromegaly.  Cardiovascular:     Rate and Rhythm: Normal rate and regular rhythm.     Heart sounds: Normal heart sounds. No murmur.  Pulmonary:     Effort: Pulmonary effort is normal.  Breath sounds: Normal breath sounds.  Abdominal:     General: Abdomen is flat. Bowel sounds are normal. There is no distension.     Palpations: Abdomen is soft.     Tenderness: There is no abdominal tenderness.  Musculoskeletal:     Cervical back: Normal range of motion and neck supple.  Lymphadenopathy:     Cervical: No cervical adenopathy.   Skin:    General: Skin is warm and dry.     Findings: No rash.  Neurological:     Mental Status: She is alert and oriented to person, place, and time.     Cranial Nerves: No cranial nerve deficit.     Coordination: Coordination normal.     Deep Tendon Reflexes: Reflexes normal.  Psychiatric:        Mood and Affect: Mood normal.        Behavior: Behavior normal.      Office Visit from 06/18/2019 in [facility]  PHQ-2 Total Score  0          Assessment/plan: 1. Annual physical exam Routine fasting labs today. She is very healthy and doing great. Continue healthy lifestyle and yoga. UTD on HM and discussed can go 5 years since had co-testing with her pap smear and she will be due in 2023. F/u in one year or as needed.  Patient counseling [x]    Nutrition: Stressed importance of moderation in sodium/caffeine intake, saturated fat and cholesterol, caloric balance, sufficient intake of fresh fruits, vegetables, fiber, calcium, iron, and 1 mg of folate supplement per day (for females capable of pregnancy).  [x]    Stressed the importance of regular exercise.   []    Substance Abuse: Discussed cessation/primary prevention of tobacco, alcohol, or other drug use; driving or other dangerous activities under the influence; availability of treatment for abuse.   [x]    Injury prevention: Discussed safety belts, safety helmets, smoke detector, smoking near bedding or upholstery.   [x]    Sexuality: Discussed sexually transmitted diseases, partner selection, use of condoms, avoidance of unintended pregnancy  and contraceptive alternatives.  [x]    Dental health: Discussed importance of regular tooth brushing, flossing, and dental visits.  [x]    Health maintenance and immunizations reviewed. Please refer to Health maintenance section.    - CBC w/Diff - Comprehensive metabolic panel - Lipid Profile - TSH - Vitamin D (25 hydroxy)  2) right knee pain Likely OA. Conservative  management at this time with continued exercise and voltaren gel prn. If worsening will come back for xray  Total time of encounter: 35 minutes total time of encounter, including 30 minutes spent in face-to-face patient care. This time includes coordination of care and counseling regarding annual exam, labs, HM. Remainder of non-face-to-face time involved reviewing chart documents/testing relevant to the patient encounter and documentation in the medical record.  This visit occurred during the SARS-CoV-2 public health emergency.  Safety protocols were in place, including screening questions prior to the visit, additional usage of staff PPE, and extensive cleaning of exam room while observing appropriate contact time as indicated for disinfecting solutions.    Return in about 1 year (around 06/17/2020).    Orma Flaming, MD [place]  06/18/2019

## 2019-06-18 NOTE — Patient Instructions (Signed)
Health Maintenance  Topic Date Due  . Mammogram  11/18/2020  . Pap Smear  09/25/2021  . Tetanus Vaccine  03/08/2024  . Colon Cancer Screening  10/22/2024  . Flu Shot  Completed  .  Hepatitis C: One time screening is recommended by Center for Disease Control  (CDC) for  adults born from [birth year] through 1964.   Completed  . HIV Screening  Completed

## 2019-06-20 ENCOUNTER — Other Ambulatory Visit: Payer: Self-pay | Admitting: Family Medicine

## 2019-06-20 DIAGNOSIS — E875 Hyperkalemia: Secondary | ICD-10-CM

## 2019-07-07 ENCOUNTER — Other Ambulatory Visit: Payer: Self-pay

## 2019-07-08 ENCOUNTER — Other Ambulatory Visit (INDEPENDENT_AMBULATORY_CARE_PROVIDER_SITE_OTHER): Payer: No Typology Code available for payment source

## 2019-07-08 DIAGNOSIS — E875 Hyperkalemia: Secondary | ICD-10-CM | POA: Diagnosis not present

## 2019-07-08 LAB — BASIC METABOLIC PANEL WITH GFR
BUN: 11 mg/dL (ref 6–23)
CO2: 27 meq/L (ref 19–32)
Calcium: 9.9 mg/dL (ref 8.4–10.5)
Chloride: 96 meq/L (ref 96–112)
Creatinine, Ser: 0.86 mg/dL (ref 0.40–1.20)
GFR: 67.79 mL/min
Glucose, Bld: 91 mg/dL (ref 70–99)
Potassium: 4.9 meq/L (ref 3.5–5.1)
Sodium: 133 meq/L — ABNORMAL LOW (ref 135–145)

## 2019-07-23 ENCOUNTER — Ambulatory Visit: Payer: No Typology Code available for payment source | Admitting: Family Medicine

## 2019-08-29 ENCOUNTER — Telehealth: Payer: Self-pay | Admitting: Family Medicine

## 2019-08-29 ENCOUNTER — Telehealth: Payer: No Typology Code available for payment source | Admitting: Nurse Practitioner

## 2019-08-29 ENCOUNTER — Other Ambulatory Visit: Payer: Self-pay | Admitting: Family Medicine

## 2019-08-29 ENCOUNTER — Encounter: Payer: Self-pay | Admitting: Family Medicine

## 2019-08-29 DIAGNOSIS — Z1231 Encounter for screening mammogram for malignant neoplasm of breast: Secondary | ICD-10-CM

## 2019-08-29 DIAGNOSIS — L247 Irritant contact dermatitis due to plants, except food: Secondary | ICD-10-CM | POA: Diagnosis not present

## 2019-08-29 DIAGNOSIS — H9190 Unspecified hearing loss, unspecified ear: Secondary | ICD-10-CM

## 2019-08-29 MED ORDER — PREDNISONE 20 MG PO TABS: ORAL_TABLET | ORAL | 0 refills | Status: DC

## 2019-08-29 NOTE — Telephone Encounter (Signed)
Please Advise

## 2019-08-29 NOTE — Telephone Encounter (Signed)
Okay to schedule "e-visit" with pt. Sounds like the Patient has the focus plan. Please clarify for pt that The "E-visit" is classified as  MyChart video visit. It is free of charge. Referrals will be placed today.    Thank You

## 2019-08-29 NOTE — Progress Notes (Signed)

## 2019-08-29 NOTE — Telephone Encounter (Signed)
Pt sent in request to schedule an e-visit for poison ivy. Called pt to schedule visit, told pt I did not have access to schedule her an e-visit but could schedule her a MyChart Video Visit to see Dr. Rogers Blocker. Pt stated she did not want to do a MyChart visit and only wanted to do the e-visit because it would be free. Pt also wanted to follow up on message she sent about referrals for yearly mammogram, ear exam, and dermatologist. Please advise.

## 2019-09-08 ENCOUNTER — Other Ambulatory Visit: Payer: Self-pay

## 2019-09-08 ENCOUNTER — Other Ambulatory Visit: Payer: Self-pay | Admitting: Family Medicine

## 2019-09-08 ENCOUNTER — Encounter: Payer: Self-pay | Admitting: Family Medicine

## 2019-09-08 DIAGNOSIS — H9190 Unspecified hearing loss, unspecified ear: Secondary | ICD-10-CM

## 2019-09-08 DIAGNOSIS — Z1231 Encounter for screening mammogram for malignant neoplasm of breast: Secondary | ICD-10-CM

## 2019-10-07 ENCOUNTER — Encounter: Payer: Self-pay | Admitting: Family Medicine

## 2019-11-05 ENCOUNTER — Other Ambulatory Visit: Payer: Self-pay

## 2019-11-05 ENCOUNTER — Encounter: Payer: Self-pay | Admitting: Family Medicine

## 2019-11-05 DIAGNOSIS — Z0100 Encounter for examination of eyes and vision without abnormal findings: Secondary | ICD-10-CM

## 2019-11-05 NOTE — Progress Notes (Signed)
amb referral 

## 2019-11-24 ENCOUNTER — Other Ambulatory Visit: Payer: Self-pay

## 2019-11-24 ENCOUNTER — Ambulatory Visit
Admission: RE | Admit: 2019-11-24 | Discharge: 2019-11-24 | Disposition: A | Discharge status: Home / Self Care | Payer: No Typology Code available for payment source | Source: Ambulatory Visit | Attending: Family Medicine | Admitting: Family Medicine

## 2019-11-24 DIAGNOSIS — Z1231 Encounter for screening mammogram for malignant neoplasm of breast: Secondary | ICD-10-CM

## 2019-11-25 ENCOUNTER — Ambulatory Visit: Payer: No Typology Code available for payment source | Admitting: Physician Assistant

## 2019-11-25 DIAGNOSIS — Z1231 Encounter for screening mammogram for malignant neoplasm of breast: Secondary | ICD-10-CM

## 2019-12-31 ENCOUNTER — Telehealth: Payer: No Typology Code available for payment source | Admitting: Nurse Practitioner

## 2019-12-31 DIAGNOSIS — L247 Irritant contact dermatitis due to plants, except food: Secondary | ICD-10-CM

## 2019-12-31 MED ORDER — PREDNISONE 10 MG PO TABS: ORAL_TABLET | ORAL | 0 refills | Status: DC

## 2019-12-31 NOTE — Progress Notes (Signed)

## 2020-01-16 ENCOUNTER — Other Ambulatory Visit: Payer: Self-pay

## 2020-01-16 ENCOUNTER — Ambulatory Visit: Payer: No Typology Code available for payment source | Admitting: Physician Assistant

## 2020-01-16 ENCOUNTER — Encounter: Payer: Self-pay | Admitting: Physician Assistant

## 2020-01-16 DIAGNOSIS — L814 Other melanin hyperpigmentation: Secondary | ICD-10-CM

## 2020-01-16 DIAGNOSIS — D1800 Hemangioma unspecified site: Secondary | ICD-10-CM | POA: Diagnosis not present

## 2020-01-16 DIAGNOSIS — L578 Other skin changes due to chronic exposure to nonionizing radiation: Secondary | ICD-10-CM

## 2020-01-16 DIAGNOSIS — L821 Other seborrheic keratosis: Secondary | ICD-10-CM

## 2020-01-16 DIAGNOSIS — Z1283 Encounter for screening for malignant neoplasm of skin: Secondary | ICD-10-CM

## 2020-01-16 NOTE — Progress Notes (Signed)
   Follow-Up Visit   Subjective  Julia Weiss is a 59 y.o. female who presents for the following: Annual Exam (skin check).   The following portions of the chart were reviewed this encounter and updated as appropriate: Tobacco  Allergies  Meds  Med Hx  Surg Hx      Objective  Well appearing patient in no apparent distress; mood and affect are within normal limits.  A full examination was performed including scalp, head, eyes, ears, nose, lips, neck, chest, axillae, abdomen, back, buttocks, bilateral upper extremities, bilateral lower extremities, hands, feet, fingers, toes, fingernails, and toenails. All findings within normal limits unless otherwise noted below.  Objective  Head - to toe: No atypical nevi No signs of non-mole skin cancer.    Assessment & Plan  Screening exam for skin cancer Head - to toe  Yearly skin exams   Lentigines - Scattered tan macules - Discussed due to sun exposure - Benign, observe - Call for any changes  Seborrheic Keratoses - Stuck-on, waxy, tan-brown papules and plaques  - Discussed benign etiology and prognosis. - Observe - Call for any changes  Hemangiomas - Red papules - Discussed benign nature - Observe - Call for any changes  Actinic Damage - diffuse scaly erythematous macules with underlying dyspigmentation - Recommend daily broad spectrum sunscreen SPF 30+ to sun-exposed areas, reapply every 2 hours as needed.  - Call for new or changing lesions.  I, Kaedin Hicklin, PA-C, have reviewed all documentation's for this visit.  The documentation on 01/16/20 for the exam, diagnosis, procedures and orders are all accurate and complete.

## 2020-07-08 ENCOUNTER — Telehealth: Payer: No Typology Code available for payment source | Admitting: Emergency Medicine

## 2020-07-08 DIAGNOSIS — M5441 Lumbago with sciatica, right side: Secondary | ICD-10-CM | POA: Diagnosis not present

## 2020-07-08 MED ORDER — CYCLOBENZAPRINE HCL 10 MG PO TABS: 10.0000 mg | ORAL_TABLET | Freq: Three times a day (TID) | ORAL | 0 refills | Status: DC | PRN

## 2020-07-08 MED ORDER — NAPROXEN 500 MG PO TABS: 500.0000 mg | ORAL_TABLET | Freq: Two times a day (BID) | ORAL | 0 refills | Status: DC

## 2020-07-08 NOTE — Progress Notes (Signed)

## 2020-08-18 ENCOUNTER — Encounter: Payer: Self-pay | Admitting: Family Medicine

## 2020-09-23 ENCOUNTER — Telehealth: Payer: No Typology Code available for payment source | Admitting: Family

## 2020-09-23 DIAGNOSIS — L255 Unspecified contact dermatitis due to plants, except food: Secondary | ICD-10-CM

## 2020-09-23 MED ORDER — PREDNISONE 10 MG PO TABS: ORAL_TABLET | ORAL | 0 refills | Status: DC

## 2020-09-23 NOTE — Progress Notes (Signed)
E-Visit for Poison Ivy  We are sorry that you are not feeing well.  Here is how we plan to help!  Based on what you have shared with me it looks like you have had an allergic reaction to the oily resin from a group of plants.  This resin is very sticky, so it easily attaches to your skin, clothing, tools equipment, and pet's fur.    This blistering rash is often called poison ivy rash although it can come from contact with the leaves, stems and roots of poison ivy, poison oak and poison sumac.  The oily resin contains urushiol (u-ROO-she-ol) that produces a skin rash on exposed skin.  The severity of the rash depends on the amount of urushiol that gets on your skin.  A section of skin with more urushiol on it may develop a rash sooner.  The rash usually develops 12-48 hours after exposure and can last two to three weeks.  Your skin must come in direct contact with the plant's oil to be affected.  Blister fluid doesn't spread the rash.  However, if you come into contact with a piece of clothing or pet fur that has urushiol on it, the rash may spread out.  You can also transfer the oil to other parts of your body with your fingers.  Often the rash looks like a straight line because of the way the plant brushes against your skin.  Since your rash is widespread or has resulted in a large number of blisters, I have prescribed an oral corticosteroid.  Please follow these recommendations:  I have sent a prednisone dose pack to your chosen pharmacy. Be sure to follow the instructions carefully and complete the entire prescription. You may use Benadryl or Caladryl topical lotions to sooth the itch and remember cool, not hot, showers and baths can help relieve the itching!  Place cool, wet compresses on the affected area for 15-30 minutes several times a day.  You may also take oral antihistamines, such as diphenhydramine (Benadryl, others), which may also help you sleep better.  Watch your skin for any purulent  (pus) drainage or red streaking from the site.  If this occurs, contact your provider.  You may require an antibiotic for a skin infection.  Make sure that the clothes you were wearing as well as any towels or sheets that may have come in contact with the oil (urushiol) are washed in detergent and hot water.       I have developed the following plan to treat your condition I am prescribing a two week course of steroids (37 tablets of 10 mg prednisone).  Days 1-4 take 4 tablets (40 mg) daily  Days 5-8 take 3 tablets (30 mg) daily, Days 9-11 take 2 tablets (20 mg) daily, Days 12-14 take 1 tablet (10 mg) daily.    What can you do to prevent this rash?  Avoid the plants.  Learn how to identify poison ivy, poison oak and poison sumac in all seasons.  When hiking or engaging in other activities that might expose you to these plants, try to stay on cleared pathways.  If camping, make sure you pitch your tent in an area free of these plants.  Keep pets from running through wooded areas so that urushiol doesn't accidentally stick to their fur, which you may touch.  Remove or kill the plants.  In your yard, you can get rid of poison ivy by applying an herbicide or pulling it out of   the ground, including the roots, while wearing heavy gloves.  Afterward remove the gloves and thoroughly wash them and your hands.  Don't burn poison ivy or related plants because the urushiol can be carried by smoke.  Wear protective clothing.  If needed, protect your skin by wearing socks, boots, pants, long sleeves and vinyl gloves.  Wash your skin right away.  Washing off the oil with soap and water within 30 minutes of exposure may reduce your chances of getting a poison ivy rash.  Even washing after an hour or so can help reduce the severity of the rash.  If you walk through some poison ivy and then later touch your shoes, you may get some urushiol on your hands, which may then transfer to your face or body by touching or  rubbing.  If the contaminated object isn't cleaned, the urushiol on it can still cause a skin reaction years later.    Be careful not to reuse towels after you have washed your skin.  Also carefully wash clothing in detergent and hot water to remove all traces of the oil.  Handle contaminated clothing carefully so you don't transfer the urushiol to yourself, furniture, rugs or appliances.  Remember that pets can carry the oil on their fur and paws.  If you think your pet may be contaminated with urushiol, put on some long rubber gloves and give your pet a bath.  Finally, be careful not to burn these plants as the smoke can contain traces of the oil.  Inhaling the smoke may result in difficulty breathing. If that occurred you should see a physician as soon as possible.  See your doctor right away if:   The reaction is severe or widespread  You inhaled the smoke from burning poison ivy and are having difficulty breathing  Your skin continues to swell  The rash affects your eyes, mouth or genitals  Blisters are oozing pus  You develop a fever greater than 100 F (37.8 C)  The rash doesn't get better within a few weeks.  If you scratch the poison ivy rash, bacteria under your fingernails may cause the skin to become infected.  See your doctor if pus starts oozing from the blisters.  Treatment generally includes antibiotics.  Poison ivy treatments are usually limited to self-care methods.  And the rash typically goes away on its own in two to three weeks.     If the rash is widespread or results in a large number of blisters, your doctor may prescribe an oral corticosteroid, such as prednisone.  If a bacterial infection has developed at the rash site, your doctor may give you a prescription for an oral antibiotic.  MAKE SURE YOU   Understand these instructions.  Will watch your condition.  Will get help right away if you are not doing well or get worse.  Thank you for choosing an  e-visit. Your e-visit answers were reviewed by a board certified advanced clinical practitioner to complete your personal care plan. Depending upon the condition, your plan could have included both over the counter or prescription medications.  Please review your pharmacy choice. If there is a problem you may use MyChart messaging to have the prescription routed to another pharmacy.   Your safety is important to us. If you have drug allergies check your prescription carefully.  You can use MyChart to ask questions about today's visit, request a non-urgent call back, or ask for a work or school excuse for 24 hours   related to this e-Visit. If it has been greater than 24 hours you will need to follow up with your provider, or enter a new e-Visit to address those concerns.   You will get an email in the next two days asking about your experience. I hope that your e-visit has been valuable and will speed your recovery Thank you for choosing an e-visit.    Approximately 5 minutes was spent documenting and reviewing patient's chart.    

## 2020-09-24 ENCOUNTER — Encounter: Payer: Self-pay | Admitting: Family Medicine

## 2020-09-24 ENCOUNTER — Other Ambulatory Visit: Payer: Self-pay

## 2020-09-24 ENCOUNTER — Ambulatory Visit (INDEPENDENT_AMBULATORY_CARE_PROVIDER_SITE_OTHER): Payer: No Typology Code available for payment source | Admitting: Family Medicine

## 2020-09-24 VITALS — BP 134/72 | HR 84 | Temp 98.2°F | Ht 64.0 in | Wt 126.8 lb

## 2020-09-24 DIAGNOSIS — Z1283 Encounter for screening for malignant neoplasm of skin: Secondary | ICD-10-CM | POA: Diagnosis not present

## 2020-09-24 DIAGNOSIS — Z0000 Encounter for general adult medical examination without abnormal findings: Secondary | ICD-10-CM

## 2020-09-24 DIAGNOSIS — Z1231 Encounter for screening mammogram for malignant neoplasm of breast: Secondary | ICD-10-CM

## 2020-09-24 LAB — LIPID PANEL
Cholesterol: 192 mg/dL (ref 0–200)
HDL: 101.5 mg/dL (ref >39.00)
LDL Cholesterol: 82 mg/dL (ref 0–99)
NonHDL: 90.79
Total CHOL/HDL Ratio: 2
Triglycerides: 43 mg/dL (ref 0.0–149.0)
VLDL: 8.6 mg/dL (ref 0.0–40.0)

## 2020-09-24 LAB — CBC WITH DIFFERENTIAL/PLATELET
Basophils Absolute: 0 10*3/uL (ref 0.0–0.1)
Basophils Relative: 0.5 % (ref 0.0–3.0)
Eosinophils Absolute: 0 10*3/uL (ref 0.0–0.7)
Eosinophils Relative: 0 % (ref 0.0–5.0)
HCT: 40 % (ref 36.0–46.0)
Hemoglobin: 13.7 g/dL (ref 12.0–15.0)
Lymphocytes Relative: 11 % — ABNORMAL LOW (ref 12.0–46.0)
Lymphs Abs: 0.7 10*3/uL (ref 0.7–4.0)
MCHC: 34.3 g/dL (ref 30.0–36.0)
MCV: 93.6 fl (ref 78.0–100.0)
Monocytes Absolute: 0.1 10*3/uL (ref 0.1–1.0)
Monocytes Relative: 2.4 % — ABNORMAL LOW (ref 3.0–12.0)
Neutro Abs: 5.1 10*3/uL (ref 1.4–7.7)
Neutrophils Relative %: 86.1 % — ABNORMAL HIGH (ref 43.0–77.0)
Platelets: 306 10*3/uL (ref 150.0–400.0)
RBC: 4.28 Mil/uL (ref 3.87–5.11)
RDW: 13.1 % (ref 11.5–15.5)
WBC: 5.9 10*3/uL (ref 4.0–10.5)

## 2020-09-24 LAB — COMPREHENSIVE METABOLIC PANEL
ALT: 15 U/L (ref 0–35)
AST: 18 U/L (ref 0–37)
Albumin: 4.7 g/dL (ref 3.5–5.2)
Alkaline Phosphatase: 70 U/L (ref 39–117)
BUN: 9 mg/dL (ref 6–23)
CO2: 26 mEq/L (ref 19–32)
Calcium: 9.6 mg/dL (ref 8.4–10.5)
Chloride: 100 mEq/L (ref 96–112)
Creatinine, Ser: 0.85 mg/dL (ref 0.40–1.20)
GFR: 75.13 mL/min (ref >60.00)
Glucose, Bld: 131 mg/dL — ABNORMAL HIGH (ref 70–99)
Potassium: 3.8 mEq/L (ref 3.5–5.1)
Sodium: 136 mEq/L (ref 135–145)
Total Bilirubin: 0.6 mg/dL (ref 0.2–1.2)
Total Protein: 7.4 g/dL (ref 6.0–8.3)

## 2020-09-24 LAB — TSH: TSH: 0.66 u[IU]/mL (ref 0.35–4.50)

## 2020-09-24 NOTE — Patient Instructions (Signed)
-not due for pap smear until next year! Yah!  -mammogram order placed -derm referral done.  -routine labs today.  -keep up the good work! You  Look great!    Preventive Care 74-59 Years Old, Female Preventive care refers to lifestyle choices and visits with your health care provider that can promote health and wellness. This includes:  A yearly physical exam. This is also called an annual wellness visit.  Regular dental and eye exams.  Immunizations.  Screening for certain conditions.  Healthy lifestyle choices, such as: ? Eating a healthy diet. ? Getting regular exercise. ? Not using drugs or products that contain nicotine and tobacco. ? Limiting alcohol use. What can I expect for my preventive care visit? Physical exam Your health care provider will check your:  Height and weight. These may be used to calculate your BMI (body mass index). BMI is a measurement that tells if you are at a healthy weight.  Heart rate and blood pressure.  Body temperature.  Skin for abnormal spots. Counseling Your health care provider may ask you questions about your:  Past medical problems.  Family's medical history.  Alcohol, tobacco, and drug use.  Emotional well-being.  Home life and relationship well-being.  Sexual activity.  Diet, exercise, and sleep habits.  Work and work Statistician.  Access to firearms.  Method of birth control.  Menstrual cycle.  Pregnancy history. What immunizations do I need? Vaccines are usually given at various ages, according to a schedule. Your health care provider will recommend vaccines for you based on your age, medical history, and lifestyle or other factors, such as travel or where you work.   What tests do I need? Blood tests  Lipid and cholesterol levels. These may be checked every 5 years, or more often if you are over 44 years old.  Hepatitis C test.  Hepatitis B test. Screening  Lung cancer screening. You may have this  screening every year starting at age 36 if you have a 30-pack-year history of smoking and currently smoke or have quit within the past 15 years.  Colorectal cancer screening. ? All adults should have this screening starting at age 52 and continuing until age 74. ? Your health care provider may recommend screening at age 47 if you are at increased risk. ? You will have tests every 1-10 years, depending on your results and the type of screening test.  Diabetes screening. ? This is done by checking your blood sugar (glucose) after you have not eaten for a while (fasting). ? You may have this done every 1-3 years.  Mammogram. ? This may be done every 1-2 years. ? Talk with your health care provider about when you should start having regular mammograms. This may depend on whether you have a family history of breast cancer.  BRCA-related cancer screening. This may be done if you have a family history of breast, ovarian, tubal, or peritoneal cancers.  Pelvic exam and Pap test. ? This may be done every 3 years starting at age 63. ? Starting at age 87, this may be done every 5 years if you have a Pap test in combination with an HPV test. Other tests  STD (sexually transmitted disease) testing, if you are at risk.  Bone density scan. This is done to screen for osteoporosis. You may have this scan if you are at high risk for osteoporosis. Talk with your health care provider about your test results, treatment options, and if necessary, the need for more  tests. Follow these instructions at home: Eating and drinking  Eat a diet that includes fresh fruits and vegetables, whole grains, lean protein, and low-fat dairy products.  Take vitamin and mineral supplements as recommended by your health care provider.  Do not drink alcohol if: ? Your health care provider tells you not to drink. ? You are pregnant, may be pregnant, or are planning to become pregnant.  If you drink alcohol: ? Limit how  much you have to 0-1 drink a day. ? Be aware of how much alcohol is in your drink. In the [place] one drink equals one 12 oz bottle of beer (355 mL), one 5 oz glass of wine (148 mL), or one 1 oz glass of hard liquor (44 mL).   Lifestyle  Take daily care of your teeth and gums. Brush your teeth every morning and night with fluoride toothpaste. Floss one time each day.  Stay active. Exercise for at least 30 minutes 5 or more days each week.  Do not use any products that contain nicotine or tobacco, such as cigarettes, e-cigarettes, and chewing tobacco. If you need help quitting, ask your health care provider.  Do not use drugs.  If you are sexually active, practice safe sex. Use a condom or other form of protection to prevent STIs (sexually transmitted infections).  If you do not wish to become pregnant, use a form of birth control. If you plan to become pregnant, see your health care provider for a prepregnancy visit.  If told by your health care provider, take low-dose aspirin daily starting at age 61.  Find healthy ways to cope with stress, such as: ? Meditation, yoga, or listening to music. ? Journaling. ? Talking to a trusted person. ? Spending time with friends and family. Safety  Always wear your seat belt while driving or riding in a vehicle.  Do not drive: ? If you have been drinking alcohol. Do not ride with someone who has been drinking. ? When you are tired or distracted. ? While texting.  Wear a helmet and other protective equipment during sports activities.  If you have firearms in your house, make sure you follow all gun safety procedures. What's next?  Visit your health care provider once a year for an annual wellness visit.  Ask your health care provider how often you should have your eyes and teeth checked.  Stay up to date on all vaccines. This information is not intended to replace advice given to you by your health care provider. Make sure you discuss any  questions you have with your health care provider. Document Revised: 01/27/2020 Document Reviewed: 01/03/2018 Elsevier Patient Education  2021 [employer].

## 2020-09-24 NOTE — Progress Notes (Signed)
Patient: Julia Weiss MRN: [id number] DOB: October 11, 1960 PCP: Orma Flaming, MD     Subjective:  Chief Complaint  Patient presents with  . Annual Exam    HPI: The patient is a 60 y.o. female who presents today for annual exam. She denies any changes to past medical history. There have been no recent hospitalizations. She is following a well balanced diet and exercise plan. 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 Weight has been stable. No complaints today. She is needing a referral for her mmg/derm. No new history. Has had covid shots.   NO family hx of colon or breast cancer in first degree relative. Maternal grandmother with breast cancer.   Currently on prednisone for poison ivy.   Immunization History  Administered Date(s) Administered  . Influenza Whole 01/30/2018  . Influenza,inj,Quad PF,6+ Mos 02/06/2019   Colonoscopy: 10/23/2014 Mammogram: 11/24/2019 Pap smear: 09/22/2016   Review of Systems  Constitutional: Negative for chills, fatigue and fever.  HENT: Negative for congestion, dental problem, ear pain, hearing loss and trouble swallowing.   Eyes: Negative for visual disturbance.  Respiratory: Negative for cough, chest tightness and shortness of breath.   Cardiovascular: Negative for chest pain, palpitations and leg swelling.  Gastrointestinal: Negative for abdominal pain, blood in stool, diarrhea and nausea.  Endocrine: Negative for cold intolerance, polydipsia, polyphagia and polyuria.  Genitourinary: Negative for dysuria and hematuria.  Musculoskeletal: Negative for arthralgias.  Skin: Negative for rash.  Neurological: Negative for dizziness and headaches.  Psychiatric/Behavioral: Negative for dysphoric mood and sleep disturbance. The patient is not nervous/anxious.     Allergies Patient has No Known Allergies.  Past Medical History Patient  has a past medical history of Chicken pox and Chickenpox.  Surgical History Patient  has a  past surgical history that includes Ovarian cyst surgery (1993).  Family History Pateint's family history includes Arthritis in her paternal grandmother; Cancer in her father, maternal grandmother, and paternal grandfather; Diabetes in her maternal uncle; Glaucoma in her maternal grandmother; Heart disease in her maternal grandmother; Hypertension in her mother; Macular degeneration in her maternal grandmother.  Social History Patient  reports that she has never smoked. She has never used smokeless tobacco. She reports current alcohol use. She reports that she does not use drugs.    Objective: Vitals:   09/24/20 0956  BP: 134/72  Pulse: 84  Temp: 98.2 F (36.8 C)  TempSrc: Temporal  SpO2: 98%  Weight: 126 lb 12.8 oz (57.5 kg)  Height: 5\' 4"  (1.626 m)    Body mass index is 21.77 kg/m.  Physical Exam Vitals reviewed.  Constitutional:      Appearance: Normal appearance. She is well-developed and normal weight.  HENT:     Head: Normocephalic and atraumatic.     Right Ear: Tympanic membrane, ear canal and external ear normal.     Left Ear: Tympanic membrane, ear canal and external ear normal.     Nose: Nose normal.     Mouth/Throat:     Mouth: Mucous membranes are moist.  Eyes:     Extraocular Movements: Extraocular movements intact.     Conjunctiva/sclera: Conjunctivae normal.     Pupils: Pupils are equal, round, and reactive to light.  Neck:     Thyroid: No thyromegaly.     Vascular: No carotid bruit.  Cardiovascular:     Rate and Rhythm: Normal rate and regular rhythm.     Pulses: Normal pulses.     Heart sounds: Normal  heart sounds. No murmur heard.   Pulmonary:     Effort: Pulmonary effort is normal.     Breath sounds: Normal breath sounds.  Abdominal:     General: Abdomen is flat. Bowel sounds are normal. There is no distension.     Palpations: Abdomen is soft.     Tenderness: There is no abdominal tenderness.  Musculoskeletal:     Cervical back: Normal range  of motion and neck supple.  Lymphadenopathy:     Cervical: No cervical adenopathy.  Skin:    General: Skin is warm and dry.     Capillary Refill: Capillary refill takes less than 2 seconds.     Findings: No rash.  Neurological:     General: No focal deficit present.     Mental Status: She is alert and oriented to person, place, and time.     Cranial Nerves: No cranial nerve deficit.     Coordination: Coordination normal.     Deep Tendon Reflexes: Reflexes normal.  Psychiatric:        Mood and Affect: Mood normal.        Behavior: Behavior normal.       Assessment/plan: 1. Annual physical exam Routine fasting labs today. HM reviewed and UTD. Referral for mmg today. Pap smear due next year. Very healthy. Continue lifestyle, incorporate more cardio, but overall doing wonderful. F/u in one year or as needed . .counsleing  - CBC with Differential/Platelet - Comprehensive metabolic panel - Lipid panel - TSH  2. Screening mammogram for breast cancer  - MM Digital Screening; Future  3. Skin cancer screening  - Ambulatory referral to Dermatology   Return in about 1 year (around 09/24/2021) for annual with alyssa .     Orma Flaming, MD [place]  09/24/2020

## 2020-09-27 ENCOUNTER — Encounter: Payer: Self-pay | Admitting: Family Medicine

## 2020-09-27 ENCOUNTER — Other Ambulatory Visit: Payer: Self-pay | Admitting: Family Medicine

## 2020-09-27 ENCOUNTER — Other Ambulatory Visit (INDEPENDENT_AMBULATORY_CARE_PROVIDER_SITE_OTHER): Payer: No Typology Code available for payment source

## 2020-09-27 DIAGNOSIS — R739 Hyperglycemia, unspecified: Secondary | ICD-10-CM

## 2020-09-27 DIAGNOSIS — D729 Disorder of white blood cells, unspecified: Secondary | ICD-10-CM

## 2020-09-28 LAB — HEMOGLOBIN A1C: Hgb A1c MFr Bld: 5.5 % (ref 4.6–6.5)

## 2020-11-22 ENCOUNTER — Telehealth: Payer: No Typology Code available for payment source | Admitting: Nurse Practitioner

## 2020-11-22 DIAGNOSIS — S90569A Insect bite (nonvenomous), unspecified ankle, initial encounter: Secondary | ICD-10-CM

## 2020-11-22 DIAGNOSIS — W57XXXA Bitten or stung by nonvenomous insect and other nonvenomous arthropods, initial encounter: Secondary | ICD-10-CM

## 2020-11-22 MED ORDER — PREDNISONE 20 MG PO TABS: 20.0000 mg | ORAL_TABLET | Freq: Two times a day (BID) | ORAL | 0 refills | Status: AC

## 2020-11-22 NOTE — Progress Notes (Signed)
E-Visit for Insect Sting  Thank you for describing the insect sting for [place].  Here is how we plan to help!  A sting that we will treat with a short course of prednisone.  The 2 greatest risks from insect stings are allergic reaction, which can be fatal in some people and infection, which is more common and less serious.  Bees, wasps, yellow jackets, and hornets belong to a class of insects called Hymenoptera.  Most insect stings cause only minor discomfort.  Stings can happen anywhere on the body and can be painful.  Most stings are from honey bees or yellow jackets.  Fire ants can sting multiple times.  The sites of the stings are more likely to become infected.    I have sent in prednisone 20 mg by mouth twice daily for 5 days to the pharmacy you selected.  Please make sure that you selected a pharmacy that is open now. Please take this medication with food.   What can be used to prevent Insect Stings?  Insect repellant with at least 20% DEET.  Wearing long pants and shirts with socks and shoes.  Wear dark or drab-colored clothes rather than bright colors.  Avoid using perfumes and hair sprays; these attract insects.  HOME CARE ADVICE:  1. Stinger removal: The stinger looks like a tiny black dot in the sting. Use a fingernail, credit card edge, or knife-edge to scrape it off.  Don't pull it out because it squeezes out more venom. If the stinger is below the skin surface, leave it alone.  It will be shed with normal skin healing. 2. Use cold compresses to the area of the sting for 10-20 minutes.  You may repeat this as needed to relieve symptoms of pain and swelling. 3.  For pain relief, take acetominophen 650 mg 4-6 hours as needed or ibuprofen 400 mg every 6-8 hours as needed or naproxen 250-500 mg every 12 hours as needed. 4.  You can also use hydrocortisone cream 0.5% or 1% up to 4 times daily as needed for itching. 5.  If the sting becomes very itchy, take Benadryl 25-50 mg,  follow directions on box. 6.  Wash the area 2-3 times daily with antibacterial soap and warm water. 7. Call your Doctor if: Fever, a severe headache, or rash occur in the next 2 weeks. Sting area begins to look infected. Redness and swelling worsens after home treatment. Your current symptoms become worse.    MAKE SURE YOU:  Understand these instructions. Will watch your condition. Will get help right away if you are not doing well or get worse.  Thank you for choosing an e-visit.  Your e-visit answers were reviewed by a board certified advanced clinical practitioner to complete your personal care plan. Depending upon the condition, your plan could have included both over the counter or prescription medications.  Please review your pharmacy choice. Make sure the pharmacy is open so you can pick up prescription now. If there is a problem, you may contact your provider through [employer] and have the prescription routed to another pharmacy.  Your safety is important to [place]. If you have drug allergies check your prescription carefully.   For the next 24 hours you can use MyChart to ask questions about today's visit, request a non-urgent call back, or ask for a work or school excuse. You will get an email in the next two days asking about your experience. I hope that your e-visit has been valuable and  will speed your recovery.   Meds ordered this encounter  Medications   predniSONE (DELTASONE) 20 MG tablet    Sig: Take 1 tablet (20 mg total) by mouth 2 (two) times daily with a meal for 5 days. Take with food    Dispense:  10 tablet    Refill:  0   I spent approximately 7 minutes reviewing the patient's history, current symptoms and coordinating their plan of care today.

## 2020-12-14 ENCOUNTER — Ambulatory Visit: Payer: No Typology Code available for payment source | Admitting: Physician Assistant

## 2020-12-20 ENCOUNTER — Other Ambulatory Visit: Payer: Self-pay | Admitting: Internal Medicine

## 2020-12-20 DIAGNOSIS — Z1231 Encounter for screening mammogram for malignant neoplasm of breast: Secondary | ICD-10-CM

## 2021-01-04 ENCOUNTER — Ambulatory Visit: Payer: No Typology Code available for payment source | Admitting: Physician Assistant

## 2021-01-25 ENCOUNTER — Encounter: Payer: Self-pay | Admitting: Physician Assistant

## 2021-01-25 ENCOUNTER — Ambulatory Visit: Payer: No Typology Code available for payment source | Admitting: Physician Assistant

## 2021-01-25 ENCOUNTER — Other Ambulatory Visit: Payer: Self-pay

## 2021-01-25 DIAGNOSIS — Z1283 Encounter for screening for malignant neoplasm of skin: Secondary | ICD-10-CM | POA: Diagnosis not present

## 2021-01-25 DIAGNOSIS — L089 Local infection of the skin and subcutaneous tissue, unspecified: Secondary | ICD-10-CM

## 2021-01-25 DIAGNOSIS — S91001A Unspecified open wound, right ankle, initial encounter: Secondary | ICD-10-CM | POA: Diagnosis not present

## 2021-01-25 MED ORDER — MUPIROCIN 2 % EX OINT: 1.0000 "application " | TOPICAL_OINTMENT | Freq: Every day | CUTANEOUS | 3 refills | Status: DC

## 2021-01-25 NOTE — Progress Notes (Signed)
   Follow-Up Visit   Subjective  Julia Weiss is a 60 y.o. female who presents for the following: Annual Exam (No new concerns- No family or personal history of melanoma or non mole skin cancers. ).   The following portions of the chart were reviewed this encounter and updated as appropriate:  Tobacco  Allergies  Meds  Problems  Med Hx  Surg Hx  Fam Hx      Objective  Well appearing patient in no apparent distress; mood and affect are within normal limits.  A full examination was performed including scalp, head, eyes, ears, nose, lips, neck, chest, axillae, abdomen, back, buttocks, bilateral upper extremities, bilateral lower extremities, hands, feet, fingers, toes, fingernails, and toenails. All findings within normal limits unless otherwise noted below.  Right Ankle - Anterior Ulceration with surrounding scale.  Head to toe No atypical nevi or signs of NMSC noted at the time of the visit.    Assessment & Plan  Wound infection Right Ankle - Anterior  mupirocin ointment (BACTROBAN) 2 % - Right Ankle - Anterior Apply 1 application topically daily.  Skin exam for malignant neoplasm Head to toe  Yearly skin check    I, Catherine Oak, PA-C, have reviewed all documentation's for this visit.  The documentation on 01/25/21 for the exam, diagnosis, procedures and orders are all accurate and complete.

## 2021-02-18 ENCOUNTER — Other Ambulatory Visit: Payer: Self-pay

## 2021-02-18 ENCOUNTER — Ambulatory Visit
Admission: RE | Admit: 2021-02-18 | Discharge: 2021-02-18 | Disposition: A | Discharge status: Home / Self Care | Payer: No Typology Code available for payment source | Source: Ambulatory Visit

## 2021-02-18 DIAGNOSIS — Z1231 Encounter for screening mammogram for malignant neoplasm of breast: Secondary | ICD-10-CM

## 2021-05-27 ENCOUNTER — Telehealth: Payer: No Typology Code available for payment source | Admitting: Physician Assistant

## 2021-05-27 DIAGNOSIS — L255 Unspecified contact dermatitis due to plants, except food: Secondary | ICD-10-CM

## 2021-05-27 MED ORDER — PREDNISONE 10 MG PO TABS: ORAL_TABLET | ORAL | 0 refills | Status: DC

## 2021-05-27 NOTE — Progress Notes (Signed)
E-Visit for Poison Ivy  We are sorry that you are not feeing well.  Here is how we plan to help!  Based on what you have shared with me it looks like you have had an allergic reaction to the oily resin from a group of plants.  This resin is very sticky, so it easily attaches to your skin, clothing, tools equipment, and pet's fur.    This blistering rash is often called poison ivy rash although it can come from contact with the leaves, stems and roots of poison ivy, poison oak and poison sumac.  The oily resin contains urushiol (u-ROO-she-ol) that produces a skin rash on exposed skin.  The severity of the rash depends on the amount of urushiol that gets on your skin.  A section of skin with more urushiol on it may develop a rash sooner.  The rash usually develops 12-48 hours after exposure and can last two to three weeks.  Your skin must come in direct contact with the plant's oil to be affected.  Blister fluid doesn't spread the rash.  However, if you come into contact with a piece of clothing or pet fur that has urushiol on it, the rash may spread out.  You can also transfer the oil to other parts of your body with your fingers.  Often the rash looks like a straight line because of the way the plant brushes against your skin.  Since your rash is widespread or has resulted in a large number of blisters, I have prescribed an oral corticosteroid.  Please follow these recommendations:  I have sent a prednisone dose pack to your chosen pharmacy. Be sure to follow the instructions carefully and complete the entire prescription. You may use Benadryl or Caladryl topical lotions to sooth the itch and remember cool, not hot, showers and baths can help relieve the itching!  Place cool, wet compresses on the affected area for 15-30 minutes several times a day.  You may also take oral antihistamines, such as diphenhydramine (Benadryl, others), which may also help you sleep better.  Watch your skin for any purulent  (pus) drainage or red streaking from the site.  If this occurs, contact your provider.  You may require an antibiotic for a skin infection.  Make sure that the clothes you were wearing as well as any towels or sheets that may have come in contact with the oil (urushiol) are washed in detergent and hot water.       I have developed the following plan to treat your condition I am prescribing a two week course of steroids (37 tablets of 10 mg prednisone).  Days 1-4 take 4 tablets (40 mg) daily  Days 5-8 take 3 tablets (30 mg) daily, Days 9-11 take 2 tablets (20 mg) daily, Days 12-14 take 1 tablet (10 mg) daily.    What can you do to prevent this rash?  Avoid the plants.  Learn how to identify poison ivy, poison oak and poison sumac in all seasons.  When hiking or engaging in other activities that might expose you to these plants, try to stay on cleared pathways.  If camping, make sure you pitch your tent in an area free of these plants.  Keep pets from running through wooded areas so that urushiol doesn't accidentally stick to their fur, which you may touch.  Remove or kill the plants.  In your yard, you can get rid of poison ivy by applying an herbicide or pulling it out of   the ground, including the roots, while wearing heavy gloves.  Afterward remove the gloves and thoroughly wash them and your hands.  Don't burn poison ivy or related plants because the urushiol can be carried by smoke.  Wear protective clothing.  If needed, protect your skin by wearing socks, boots, pants, long sleeves and vinyl gloves.  Wash your skin right away.  Washing off the oil with soap and water within 30 minutes of exposure may reduce your chances of getting a poison ivy rash.  Even washing after an hour or so can help reduce the severity of the rash.  If you walk through some poison ivy and then later touch your shoes, you may get some urushiol on your hands, which may then transfer to your face or body by touching or  rubbing.  If the contaminated object isn't cleaned, the urushiol on it can still cause a skin reaction years later.    Be careful not to reuse towels after you have washed your skin.  Also carefully wash clothing in detergent and hot water to remove all traces of the oil.  Handle contaminated clothing carefully so you don't transfer the urushiol to yourself, furniture, rugs or appliances.  Remember that pets can carry the oil on their fur and paws.  If you think your pet may be contaminated with urushiol, put on some long rubber gloves and give your pet a bath.  Finally, be careful not to burn these plants as the smoke can contain traces of the oil.  Inhaling the smoke may result in difficulty breathing. If that occurred you should see a physician as soon as possible.  See your doctor right away if:  The reaction is severe or widespread You inhaled the smoke from burning poison ivy and are having difficulty breathing Your skin continues to swell The rash affects your eyes, mouth or genitals Blisters are oozing pus You develop a fever greater than 100 F (37.8 C) The rash doesn't get better within a few weeks.  If you scratch the poison ivy rash, bacteria under your fingernails may cause the skin to become infected.  See your doctor if pus starts oozing from the blisters.  Treatment generally includes antibiotics.  Poison ivy treatments are usually limited to self-care methods.  And the rash typically goes away on its own in two to three weeks.     If the rash is widespread or results in a large number of blisters, your doctor may prescribe an oral corticosteroid, such as prednisone.  If a bacterial infection has developed at the rash site, your doctor may give you a prescription for an oral antibiotic.  MAKE SURE YOU  Understand these instructions. Will watch your condition. Will get help right away if you are not doing well or get worse.   Thank you for choosing an e-visit.  Your  e-visit answers were reviewed by a board certified advanced clinical practitioner to complete your personal care plan. Depending upon the condition, your plan could have included both over the counter or prescription medications.  Please review your pharmacy choice. Make sure the pharmacy is open so you can pick up prescription now. If there is a problem, you may contact your provider through MyChart messaging and have the prescription routed to another pharmacy.  Your safety is important to us. If you have drug allergies check your prescription carefully.   For the next 24 hours you can use MyChart to ask questions about today's visit, request a non-urgent   call back, or ask for a work or school excuse. You will get an email in the next two days asking about your experience. I hope that your e-visit has been valuable and will speed your recovery.   I provided 5 minutes of non face-to-face time during this encounter for chart review and documentation.   

## 2021-07-12 ENCOUNTER — Encounter: Payer: Self-pay | Admitting: Internal Medicine

## 2021-09-26 ENCOUNTER — Ambulatory Visit (INDEPENDENT_AMBULATORY_CARE_PROVIDER_SITE_OTHER): Payer: No Typology Code available for payment source | Admitting: Internal Medicine

## 2021-09-26 ENCOUNTER — Encounter: Payer: Self-pay | Admitting: Internal Medicine

## 2021-09-26 VITALS — BP 118/70 | HR 82 | Resp 18 | Ht 64.0 in | Wt 128.0 lb

## 2021-09-26 DIAGNOSIS — Z13220 Encounter for screening for lipoid disorders: Secondary | ICD-10-CM

## 2021-09-26 DIAGNOSIS — R200 Anesthesia of skin: Secondary | ICD-10-CM

## 2021-09-26 DIAGNOSIS — Z0000 Encounter for general adult medical examination without abnormal findings: Secondary | ICD-10-CM | POA: Diagnosis not present

## 2021-09-26 DIAGNOSIS — Z1231 Encounter for screening mammogram for malignant neoplasm of breast: Secondary | ICD-10-CM

## 2021-09-26 DIAGNOSIS — E538 Deficiency of other specified B group vitamins: Secondary | ICD-10-CM

## 2021-09-26 DIAGNOSIS — R202 Paresthesia of skin: Secondary | ICD-10-CM | POA: Diagnosis not present

## 2021-09-26 LAB — LIPID PANEL
Cholesterol: 191 mg/dL (ref 0–200)
HDL: 92.2 mg/dL (ref >39.00)
LDL Cholesterol: 90 mg/dL (ref 0–99)
NonHDL: 98.75
Total CHOL/HDL Ratio: 2
Triglycerides: 46 mg/dL (ref 0.0–149.0)
VLDL: 9.2 mg/dL (ref 0.0–40.0)

## 2021-09-26 LAB — CBC
HCT: 40 % (ref 36.0–46.0)
Hemoglobin: 13.5 g/dL (ref 12.0–15.0)
MCHC: 33.8 g/dL (ref 30.0–36.0)
MCV: 94.1 fl (ref 78.0–100.0)
Platelets: 295 10*3/uL (ref 150.0–400.0)
RBC: 4.26 Mil/uL (ref 3.87–5.11)
RDW: 12.6 % (ref 11.5–15.5)
WBC: 5.5 10*3/uL (ref 4.0–10.5)

## 2021-09-26 LAB — COMPREHENSIVE METABOLIC PANEL
ALT: 20 U/L (ref 0–35)
AST: 22 U/L (ref 0–37)
Albumin: 4.7 g/dL (ref 3.5–5.2)
Alkaline Phosphatase: 79 U/L (ref 39–117)
BUN: 12 mg/dL (ref 6–23)
CO2: 29 mEq/L (ref 19–32)
Calcium: 9.7 mg/dL (ref 8.4–10.5)
Chloride: 99 mEq/L (ref 96–112)
Creatinine, Ser: 0.82 mg/dL (ref 0.40–1.20)
GFR: 77.88 mL/min (ref >60.00)
Glucose, Bld: 93 mg/dL (ref 70–99)
Potassium: 4 mEq/L (ref 3.5–5.1)
Sodium: 134 mEq/L — ABNORMAL LOW (ref 135–145)
Total Bilirubin: 0.6 mg/dL (ref 0.2–1.2)
Total Protein: 7.4 g/dL (ref 6.0–8.3)

## 2021-09-26 LAB — VITAMIN B12: Vitamin B-12: 282 pg/mL (ref 211–911)

## 2021-09-26 LAB — VITAMIN D 25 HYDROXY (VIT D DEFICIENCY, FRACTURES): VITD: 32.03 ng/mL (ref 30.00–100.00)

## 2021-09-26 NOTE — Patient Instructions (Addendum)
We will check the labs today.  Get the shingles vaccine at the pharmacy. 2 doses about 2-3 months apart.

## 2021-09-26 NOTE — Progress Notes (Unsigned)
   Subjective:   Patient ID: Julia Weiss, female    DOB: [birth year], 61 y.o.   MRN: [id number]  HPI The patient is here for physical as well as TOC.  PMH, [facility], social history reviewed and updated  Review of Systems  Constitutional: Negative.   HENT: Negative.    Eyes: Negative.   Respiratory:  Negative for cough, chest tightness and shortness of breath.   Cardiovascular:  Negative for chest pain, palpitations and leg swelling.  Gastrointestinal:  Negative for abdominal distention, abdominal pain, constipation, diarrhea, nausea and vomiting.  Musculoskeletal: Negative.   Skin: Negative.   Neurological: Negative.   Psychiatric/Behavioral: Negative.     Objective:  Physical Exam Constitutional:      Appearance: She is well-developed.  HENT:     Head: Normocephalic and atraumatic.  Cardiovascular:     Rate and Rhythm: Normal rate and regular rhythm.  Pulmonary:     Effort: Pulmonary effort is normal. No respiratory distress.     Breath sounds: Normal breath sounds. No wheezing or rales.  Abdominal:     General: Bowel sounds are normal. There is no distension.     Palpations: Abdomen is soft.     Tenderness: There is no abdominal tenderness. There is no rebound.  Musculoskeletal:     Cervical back: Normal range of motion.  Skin:    General: Skin is warm and dry.  Neurological:     Mental Status: She is alert and oriented to person, place, and time.     Coordination: Coordination normal.    Vitals:   09/26/21 1309  BP: 118/70  Pulse: 82  Resp: 18  SpO2: 100%  Weight: 128 lb (58.1 kg)  Height: '5\' 4"'$  (1.626 m)    This visit occurred during the SARS-CoV-2 public health emergency.  Safety protocols were in place, including screening questions prior to the visit, additional usage of staff PPE, and extensive cleaning of exam room while observing appropriate contact time as indicated for disinfecting solutions.   Assessment & Plan:

## 2021-09-29 DIAGNOSIS — E538 Deficiency of other specified B group vitamins: Secondary | ICD-10-CM | POA: Insufficient documentation

## 2021-09-29 DIAGNOSIS — Z0000 Encounter for general adult medical examination without abnormal findings: Secondary | ICD-10-CM | POA: Insufficient documentation

## 2021-09-29 DIAGNOSIS — R200 Anesthesia of skin: Secondary | ICD-10-CM | POA: Insufficient documentation

## 2021-09-29 NOTE — Assessment & Plan Note (Signed)
Checking B12 and vitamin D and CBC and CMP to rule out etiology. Prior low B12 which was not treated which could be source or carpal tunnel.

## 2021-09-29 NOTE — Assessment & Plan Note (Signed)
Flu shot yearly. Covid-19 counseled. Shingrix counseled declines today. Tetanus up to date. Colonoscopy up to date. Mammogram up to date, pap smear due. Counseled about sun safety and mole surveillance. Counseled about the dangers of distracted driving. Given 10 year screening recommendations.

## 2021-09-29 NOTE — Assessment & Plan Note (Signed)
Previously low however no prior replacement. With new tingling in hands needs B12 levels and replacement if low.

## 2021-12-15 ENCOUNTER — Other Ambulatory Visit (HOSPITAL_COMMUNITY)
Admission: RE | Admit: 2021-12-15 | Discharge: 2021-12-15 | Disposition: A | Discharge status: Home / Self Care | Payer: No Typology Code available for payment source | Source: Ambulatory Visit | Attending: Radiology | Admitting: Radiology

## 2021-12-15 ENCOUNTER — Encounter: Payer: Self-pay | Admitting: Radiology

## 2021-12-15 ENCOUNTER — Ambulatory Visit (INDEPENDENT_AMBULATORY_CARE_PROVIDER_SITE_OTHER): Payer: No Typology Code available for payment source | Admitting: Radiology

## 2021-12-15 VITALS — BP 122/76 | Ht 64.0 in | Wt 125.0 lb

## 2021-12-15 DIAGNOSIS — Z01419 Encounter for gynecological examination (general) (routine) without abnormal findings: Secondary | ICD-10-CM

## 2021-12-15 NOTE — Progress Notes (Signed)
   Julia Weiss February 27, 1961 [id number]   History: Postmenopausal 61 y.o. presents for annual exam as a new patient. Sees her PCP for general health maintenance. No gyn concerns.   Gynecologic History Postmenopausal Last Pap: 2018. Results were: normal Last mammogram: 2022. Results were: normal Last colonoscopy: 2016 HRT use: never  Obstetric History OB History  Gravida Para Term Preterm AB Living  '1 1 1     1  '$ SAB IAB Ectopic Multiple Live Births          1    # Outcome Date GA Lbr Len/2nd Weight Sex Delivery Anes PTL Lv  1 Term              The following portions of the patient's history were reviewed and updated as appropriate: allergies, current medications, past family history, past medical history, past social history, past surgical history, and problem list.  Review of Systems Pertinent items noted in HPI and remainder of comprehensive ROS otherwise negative.  Past medical history, past surgical history, family history and social history were all reviewed and documented in the EPIC chart.  Exam:  Vitals:   12/15/21 0944  BP: 122/76  Weight: 125 lb (56.7 kg)  Height: '5\' 4"'$  (1.626 m)   Body mass index is 21.46 kg/m.  General appearance:  Normal Thyroid:  Symmetrical, normal in size, without palpable masses or nodularity. Respiratory  Auscultation:  Clear without wheezing or rhonchi Cardiovascular  Auscultation:  Regular rate, without rubs, murmurs or gallops  Edema/varicosities:  Not grossly evident Abdominal  Soft,nontender, without masses, guarding or rebound.  Liver/spleen:  No organomegaly noted  Hernia:  None appreciated  Skin  Inspection:  Grossly normal Breasts: Examined lying and sitting.   Right: Without masses, retractions, nipple discharge or axillary adenopathy.   Left: Without masses, retractions, nipple discharge or axillary adenopathy. Genitourinary   Inguinal/mons:  Normal without inguinal adenopathy  External genitalia:  Normal  appearing vulva with no masses, tenderness, or lesions  BUS/Urethra/Skene's glands:  Normal  Vagina:  Normal appearing with normal color and discharge, no lesions. Atrophy: mild   Cervix:  Normal appearing without discharge or lesions  Uterus:  Normal in size, shape and contour.  Midline and mobile, nontender  Adnexa/parametria:     Rt: Normal in size, without masses or tenderness.   Lt: Normal in size, without masses or tenderness.  Anus and perineum: Normal    Patient informed chaperone available to be present for breast and pelvic exam. Patient has requested no chaperone to be present. Patient has been advised what will be completed during breast and pelvic exam.   Assessment/Plan:   1. Well woman exam with routine gynecological exam  - Cytology - PAP( [facility])    Discussed SBE, colonoscopy and DEXA screening as directed. Recommend 143mns of exercise weekly, including weight bearing exercise. Encouraged the use of seatbelts and sunscreen.  Return in 1 year for annual or sooner prn.  CRubbie BattiestB WHNP-BC, 11:14 AM 12/15/2021

## 2021-12-19 LAB — CYTOLOGY - PAP
Comment: NEGATIVE
Diagnosis: NEGATIVE
High risk HPV: NEGATIVE

## 2022-01-26 ENCOUNTER — Ambulatory Visit: Payer: No Typology Code available for payment source | Admitting: Physician Assistant

## 2022-02-16 ENCOUNTER — Encounter: Payer: Self-pay | Admitting: Internal Medicine

## 2022-02-16 ENCOUNTER — Ambulatory Visit: Payer: No Typology Code available for payment source | Admitting: Physician Assistant

## 2022-02-20 ENCOUNTER — Ambulatory Visit
Admission: RE | Admit: 2022-02-20 | Discharge: 2022-02-20 | Disposition: A | Discharge status: Home / Self Care | Payer: No Typology Code available for payment source | Source: Ambulatory Visit | Attending: Internal Medicine | Admitting: Internal Medicine

## 2022-02-20 DIAGNOSIS — Z1231 Encounter for screening mammogram for malignant neoplasm of breast: Secondary | ICD-10-CM

## 2022-08-17 ENCOUNTER — Telehealth: Payer: 59 | Admitting: Physician Assistant

## 2022-08-17 DIAGNOSIS — S40261A Insect bite (nonvenomous) of right shoulder, initial encounter: Secondary | ICD-10-CM

## 2022-08-17 DIAGNOSIS — W57XXXA Bitten or stung by nonvenomous insect and other nonvenomous arthropods, initial encounter: Secondary | ICD-10-CM

## 2022-08-17 MED ORDER — DOXYCYCLINE HYCLATE 100 MG PO TABS: 200.0000 mg | ORAL_TABLET | Freq: Once | ORAL | 0 refills | Status: AC

## 2022-08-17 NOTE — Progress Notes (Signed)
E-Visit for Tick Bite  Thank you for describing your tick bite, Here is how we plan to help! Based on the information that you shared with me it looks like you have An uncomplicated tick bite that just occurred and can be closely follow using the instructions in your care plan.  In most cases a tick bite is painless and does not itch.  Most tick bites in which the tick is quickly removed do not require prescriptions. Ticks can transmit several diseases if they are infected and remain attacked to your skin. Therefore the length that the tick was attached and any symptoms you have experienced after the bite are import to accurately develop your custom treatment plan. In most cases a single dose of doxycycline may prevent the development of a more serious condition.  Based on your information I have Provided a home care guide for tick bites and  instructions on when to call for help. and I have sent a single dose of doxycycline to the pharmacy you selected. Please make sure that you selected a pharmacy that is open now.  Which ticks  are associated with illness?  The Wood Tick (dog tick) is the size of a watermelon seed and can sometimes transmit [facility] spotted fever and [place] tick fever.   The Deer Tick (black-legged tick) is between the size of a poppy seed (pin head) and an apple seed, and can sometimes transmit Lyme disease.  A brown to black tick with a white splotch on its back is likely a female Amblyomma americanum ([place] tick). This tick has been associated with Southern Tick Associated illness ( STARI)  Lyme disease has become the most common tick-borne illness in the [place]. The risk of Lyme disease following a recognized deer tick bite is estimated to be 1%.  The majority of cases of Lyme disease start with a bull's eye rash at the site of the tick bite. The rash can occur days to weeks (typically 7-10 days) after a tick bite. Treatment with antibiotics is  indicated if this rash appears. Flu-like symptoms may accompany the rash, including: fever, chills, headaches, muscle aches, and fatigue. Removing ticks promptly may prevent tick borne disease.  What can be used to prevent Tick Bites?  Insect repellant with at leas 20% DEET. Wearing long pants with sock and shoes. Avoiding tall grass and heavily wooded areas. Checking your skin after being outdoors. Shower with a washcloth after outdoor exposures.  HOME CARE ADVICE FOR TICK BITE  Wood Tick Removal:  Use a pair of tweezers and grasp the wood tick close to the skin (on its head). Pull the wood tick straight upward without twisting or crushing it. Maintain a steady pressure until it releases its grip.   If tweezers aren't available, use fingers, a loop of thread around the jaws, or a needle between the jaws for traction.  Note: covering the tick with petroleum jelly, nail polish or rubbing alcohol doesn't work. Neither does touching the tick with a hot or cold object. Tiny Deer Tick Removal:   Needs to be scraped off with a knife blade or credit card edge. Place tick in a sealed container (e.g. glass jar, zip lock plastic bag), in case your doctor wants to see it. Tick's Head Removal:  If the wood tick's head breaks off in the skin, it must be removed. Clean the skin. Then use a sterile needle to uncover the head and lift it out or scrape it off.  If a very small piece of the head remains, the skin will eventually slough it off. Antibiotic Ointment:  Wash the wound and your hands with soap and water after removal to prevent catching any tick disease.  Apply an over the counter antibiotic ointment (e.g. bacitracin) to the bite once. Expected Course: Tick bites normally don't itch or hurt. That's why they often go unnoticed. Call Your Doctor If:  You can't remove the tick or the tick's head Fever, a severe head ache, or rash occur in the next 2 weeks Bite begins to look infected Lyme's  disease is common in your area You have not had a tetanus in the last 10 years Your current symptoms become worse    MAKE SURE YOU  Understand these instructions. Will watch your condition. Will get help right away if you are not doing well or get worse.    Thank you for choosing an e-visit.  Your e-visit answers were reviewed by a board certified advanced clinical practitioner to complete your personal care plan. Depending upon the condition, your plan could have included both over the counter or prescription medications.  Please review your pharmacy choice. Make sure the pharmacy is open so you can pick up prescription now. If there is a problem, you may contact your provider through [employer] and have the prescription routed to another pharmacy.  Your safety is important to [place]. If you have drug allergies check your prescription carefully.   For the next 24 hours you can use MyChart to ask questions about today's visit, request a non-urgent call back, or ask for a work or school excuse. You will get an email in the next two days asking about your experience. I hope that your e-visit has been valuable and will speed your recovery.  I have spent 5 minutes in review of e-visit questionnaire, review and updating patient chart, medical decision making and response to patient.   Margaretann Loveless, PA-C

## 2022-10-16 IMAGING — MG MM DIGITAL SCREENING BILAT W/ TOMO AND CAD
6 of 10 series · 6 of 30 positions shown · non-contrast
Comparison: Previous exam(s).

CLINICAL DATA: Screening.

EXAM:
DIGITAL SCREENING BILATERAL MAMMOGRAM WITH TOMOSYNTHESIS AND CAD
TECHNIQUE: Bilateral screening digital craniocaudal and mediolateral oblique
mammograms were obtained. Bilateral screening digital breast
tomosynthesis was performed. The images were evaluated with
computer-aided detection.

[R MLO synth-2D]
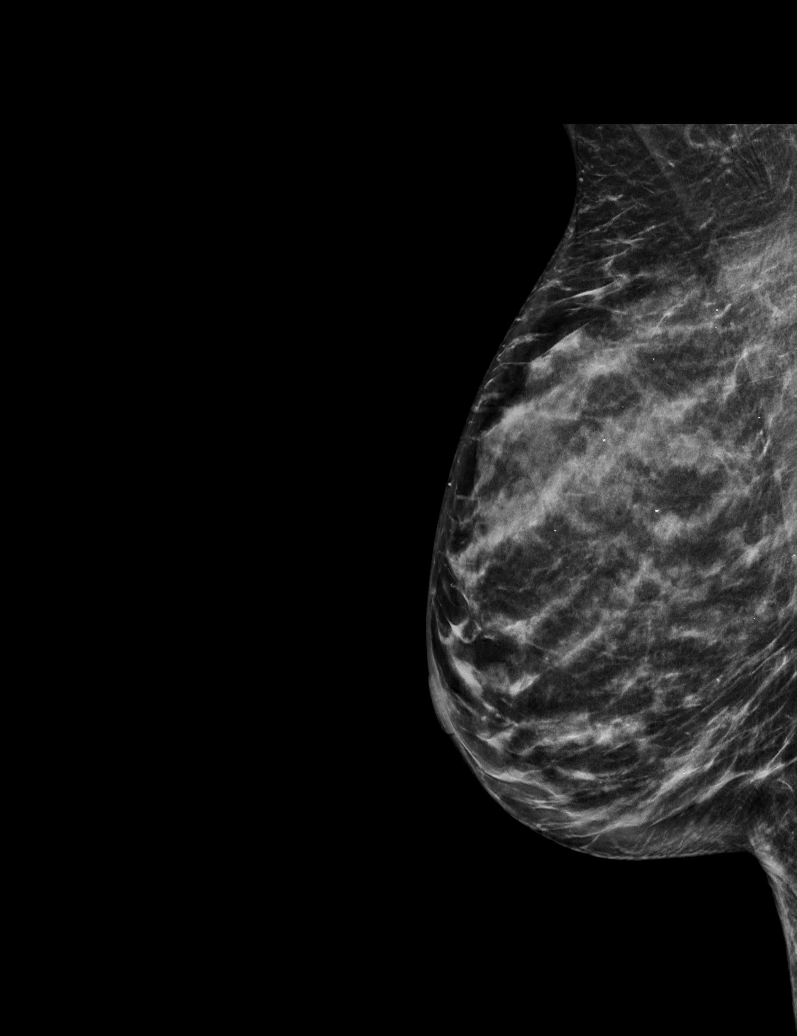

[R CC synth-2D (1 of 2)]
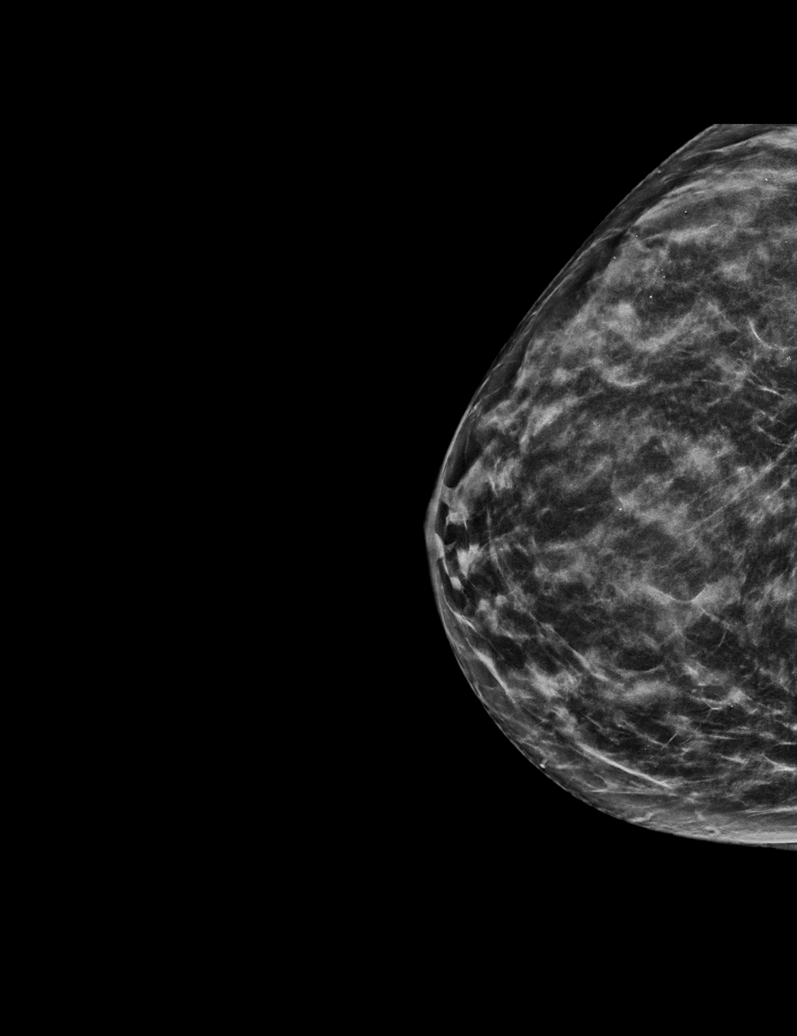

[R CC synth-2D (2 of 2)]
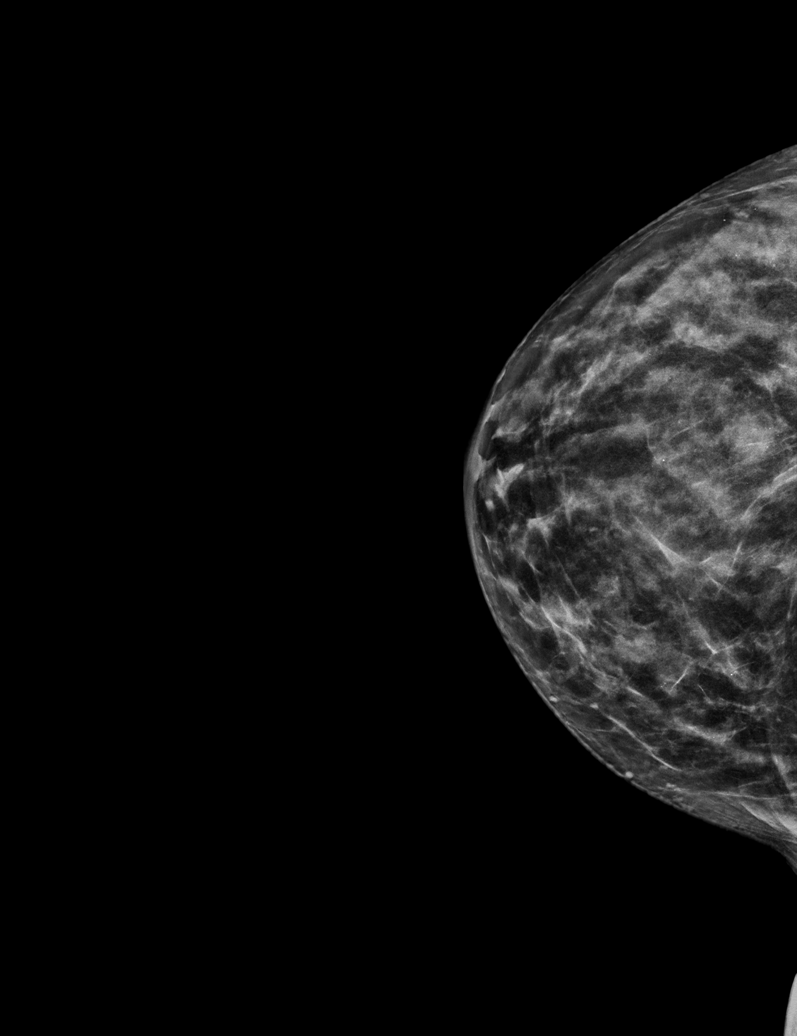

[L MLO synth-2D]
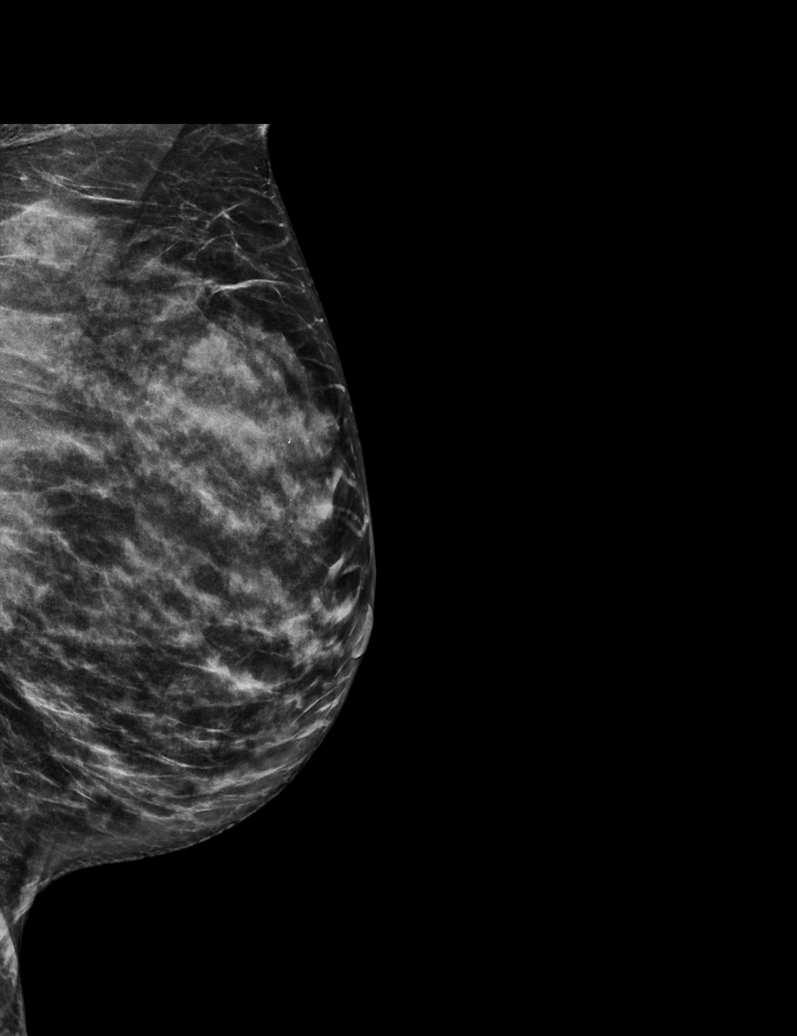

[L CC synth-2D]
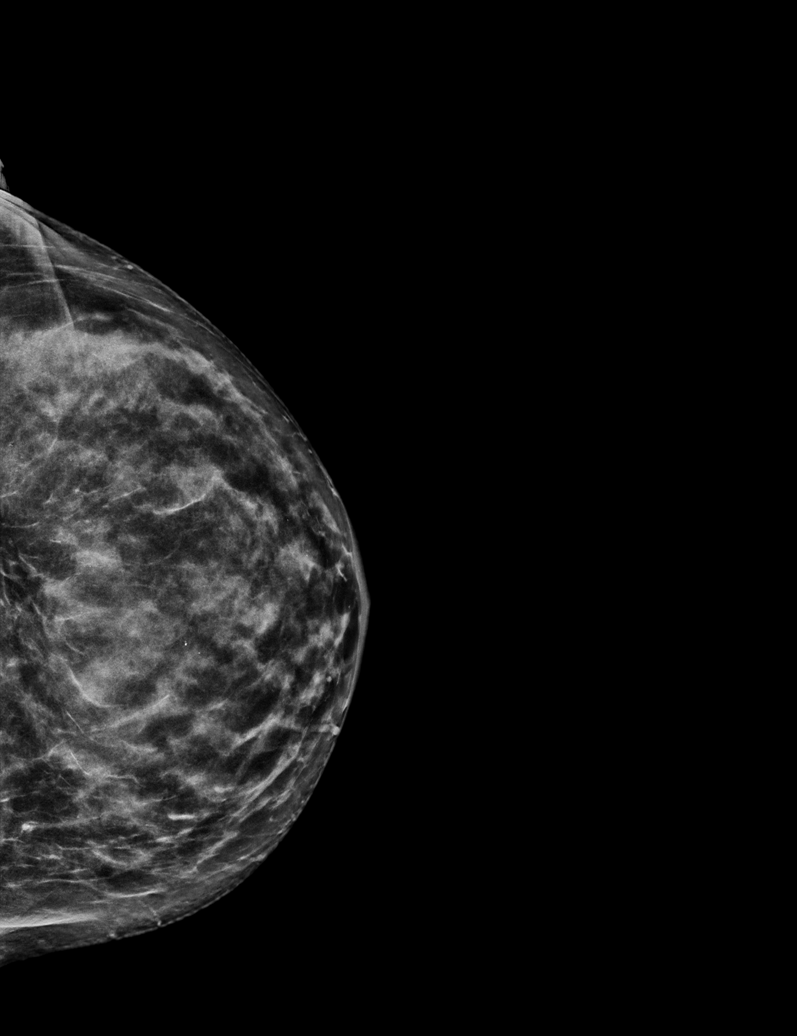

[L MLO tomo · tomo slice 31/61.0]
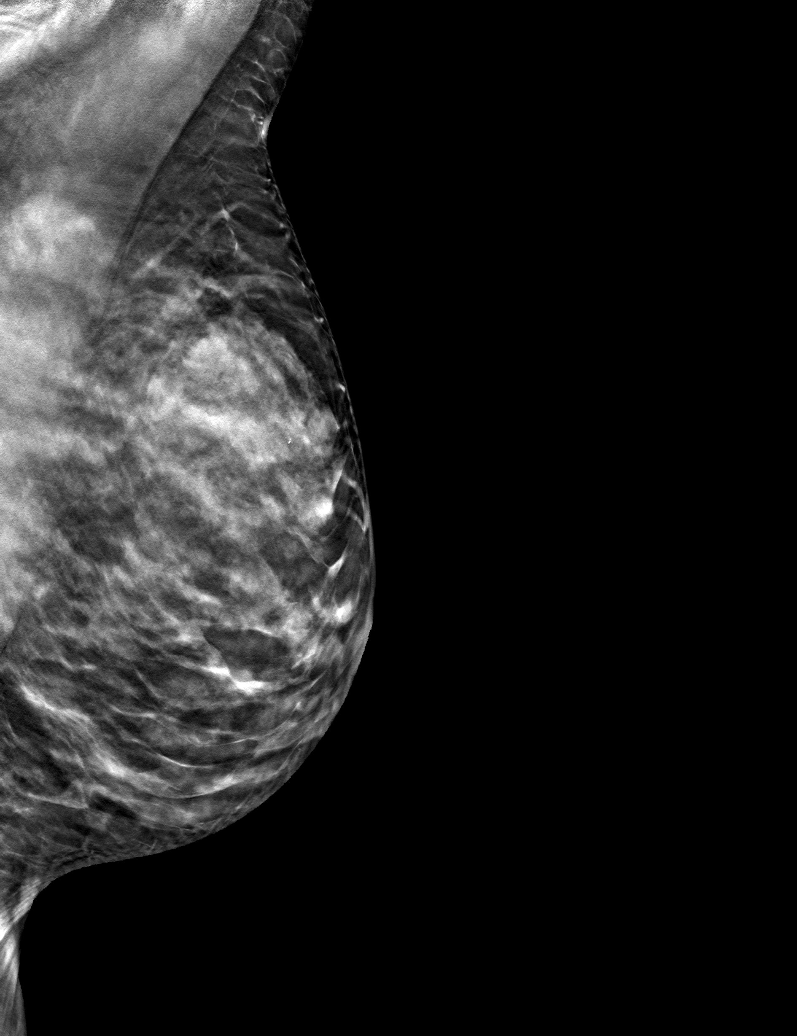

[6 of 30 positions shown; findings below may reference images not displayed]

ACR Breast Density Category c: The breast tissue is heterogeneously
dense, which may obscure small masses.
FINDINGS: There are no findings suspicious for malignancy.
IMPRESSION: No mammographic evidence of malignancy. A result letter of this
screening mammogram will be mailed directly to the patient.

RECOMMENDATION:
Screening mammogram in one year. (Code:Q3-W-BC3)

BI-RADS CATEGORY  1: Negative.
